# Patient Record
Sex: Female | Born: 1955 | Race: White | Hispanic: No | Marital: Single
Health system: Midwestern US, Academic
[De-identification: ages and names within clinical notes are randomized; demographics above are authoritative.]

---

## 2016-10-21 ENCOUNTER — Ambulatory Visit: Admit: 2016-10-21 | Discharge: 2016-10-21 | Payer: Commercial Managed Care - HMO

## 2016-10-21 ENCOUNTER — Encounter: Admit: 2016-10-21 | Discharge: 2016-10-21 | Payer: Private health insurance—other commercial Indemnity

## 2016-10-21 ENCOUNTER — Encounter: Admit: 2016-10-21 | Discharge: 2016-10-21 | Payer: Commercial Managed Care - HMO

## 2016-10-21 DIAGNOSIS — I10 Essential (primary) hypertension: ICD-10-CM

## 2016-10-21 DIAGNOSIS — F329 Major depressive disorder, single episode, unspecified: ICD-10-CM

## 2016-10-21 DIAGNOSIS — M549 Dorsalgia, unspecified: ICD-10-CM

## 2016-10-21 DIAGNOSIS — K219 Gastro-esophageal reflux disease without esophagitis: ICD-10-CM

## 2016-10-21 DIAGNOSIS — F419 Anxiety disorder, unspecified: ICD-10-CM

## 2016-10-21 DIAGNOSIS — Z01419 Encounter for gynecological examination (general) (routine) without abnormal findings: Principal | ICD-10-CM

## 2016-10-21 DIAGNOSIS — N189 Chronic kidney disease, unspecified: ICD-10-CM

## 2016-10-21 NOTE — Progress Notes
Date of Service: 10/21/2016    Subjective:             Sandy Henderson is a 61 y.o. female.    History of Present Illness  Sandy Henderson 61 y.o. G0P0000 who presents for WWE.  Has GERD that doesn't seem to be related to food intake. Hasn't tried anything for it.  No GYN issues.  H/O LEEP- paps nl since then. Mammo UTD.  No PMB.      Past Medical History:   Diagnosis Date   ??? Abnormal Papanicolaou smear of vagina and vaginal HPV    ??? Anxiety    ??? Back pain    ??? Chronic kidney disease    ??? Depression    ??? HTN (hypertension)        Past Surgical History:   Procedure Laterality Date   ??? PARTIAL THYROIDECTOMY  1985   ??? GASTRIC BYPASS  2001   ??? COLPOSCOPY     ??? LEEP PROCEDURE     ??? UMBILICAL HERNIA REPAIR         Obstetric History    G0   P0   T0   P0   A0   L0     SAB0   TAB0   Ectopic0   Multiple0   Live Births0        Social History   Substance Use Topics   ??? Smoking status: Never Smoker   ??? Smokeless tobacco: Never Used   ??? Alcohol use Yes      Comment: weekly       Family History   Problem Relation Age of Onset   ??? Heart Disease Mother    ??? Diabetes Mother    ??? Anemia Mother    ??? Heart Disease Father    ??? Stroke Father    ??? Cancer-Ovarian Maternal Aunt    ??? Diabetes Maternal Aunt    ??? Cancer Sister    ??? Heart Disease Maternal Grandmother    ??? Heart Disease Maternal Grandfather    ??? Heart Disease Paternal Grandfather    ??? Stroke Paternal Grandfather        Allergies:  No Known Allergies     Review of Systems   Constitutional: Negative for fatigue, fever and unexpected weight change.   HENT: Negative for voice change.    Respiratory: Negative for cough and shortness of breath.    Cardiovascular: Negative for chest pain and leg swelling.   Gastrointestinal: Positive for nausea. Negative for abdominal pain, blood in stool, constipation, diarrhea and vomiting.   Genitourinary: Negative for difficulty urinating, dyspareunia, dysuria, enuresis, frequency, genital sores, hematuria, menstrual problem, pelvic pain, urgency, vaginal bleeding, vaginal discharge and vaginal pain.   Musculoskeletal: Positive for arthralgias and back pain.   Skin: Negative for rash.   Neurological: Negative for light-headedness and headaches.   Hematological: Negative for adenopathy. Does not bruise/bleed easily.   Psychiatric/Behavioral: Negative for confusion. The patient is nervous/anxious.          Objective:         ??? acyclovir (ZOVIRAX) 400 mg tablet Take 400 mg by mouth as Needed (for outbreaks).   ??? buPROPion SR(+) (WELLBUTRIN-SR) 150 mg tablet Take 150 mg by mouth twice daily.   ??? LORazepam (ATIVAN) 1 mg tablet Take 1 mg by mouth twice daily (at 10AM and 10PM).   ??? losartan (COZAAR) 50 mg tablet Take 50 mg by mouth daily.      10/21/16 10:47 AM  BP  126/84???    Pulse  97???    Weight   117.7???kg???(259???lb???6.4???oz)???    Height  168.9???cm???(66.5)???    Other Vitals   BMI 41.24 kg/m2   BSA 2.35 m2   Menstrual status: Postmenopausal   OB/Gyn status reviewed 10/21/2016   Tobacco   Smoking Status Never Smoker   Smokeless Status Never Used   Reviewed 10/21/2016             Physical Exam    General:     NAD   HEENT:  Normocephalic   Thyroid:  normal to inspection and palpation   Lymph Nodes:  Supraclavicular, and  nodes normal.   Lungs:  Effort normal.    Heart:  Normal rate and intact distal pulses.     Abdomen:  Soft. She exhibits no distension and no mass. There is no tenderness. There is no rebound and no guarding.    Neurologic:  oriented, normal mood   Vulva:  No Lesions   GU:  normal   Vagina:  Normal mucosa, no discharge   Cervix:  no lesions   Uterus:  Normal shape, position and consistency   Left Adnexa:  No masses, nodularity, tenderness   Right Adnexa:  No masses, nodularity, tenderness   Rectovaginal:  Deferred     Breast: No masses, nipple discharge, or skin changes bilaterally         Assessment and Plan:  WWE-  Pap with HPV cotest  Mammo UTD  GI referral for GERD    Lynelle Doctor MD

## 2016-11-24 ENCOUNTER — Encounter: Admit: 2016-11-24 | Discharge: 2016-11-24 | Payer: Private Health Insurance - Indemnity

## 2016-11-24 ENCOUNTER — Ambulatory Visit: Admit: 2016-11-24 | Discharge: 2016-11-25 | Payer: Commercial Managed Care - HMO

## 2016-11-24 DIAGNOSIS — N390 Urinary tract infection, site not specified: ICD-10-CM

## 2016-11-24 DIAGNOSIS — R1031 Right lower quadrant pain: ICD-10-CM

## 2016-11-24 DIAGNOSIS — I10 Essential (primary) hypertension: ICD-10-CM

## 2016-11-24 DIAGNOSIS — R1013 Epigastric pain: ICD-10-CM

## 2016-11-24 DIAGNOSIS — F419 Anxiety disorder, unspecified: ICD-10-CM

## 2016-11-24 DIAGNOSIS — Z87442 Personal history of urinary calculi: ICD-10-CM

## 2016-11-24 DIAGNOSIS — R109 Unspecified abdominal pain: Principal | ICD-10-CM

## 2016-11-24 DIAGNOSIS — Z86010 Personal history of colonic polyps: ICD-10-CM

## 2016-11-24 DIAGNOSIS — N189 Chronic kidney disease, unspecified: ICD-10-CM

## 2016-11-24 DIAGNOSIS — M549 Dorsalgia, unspecified: ICD-10-CM

## 2016-11-24 DIAGNOSIS — F329 Major depressive disorder, single episode, unspecified: ICD-10-CM

## 2016-11-24 MED ORDER — PANTOPRAZOLE 40 MG PO TBEC
40 mg | ORAL_TABLET | Freq: Every day | ORAL | 5 refills | 90.00000 days | Status: AC
Start: 2016-11-24 — End: 2017-04-14

## 2016-11-24 MED ORDER — SODIUM CHLORIDE 0.9 % IV SOLP
INTRAVENOUS | 0 refills | Status: CN
Start: 2016-11-24 — End: ?

## 2016-11-24 MED ORDER — MOVIPREP 100-7.5-2.691 GRAM PO PWPK
0 refills | Status: AC
Start: 2016-11-24 — End: 2017-02-23

## 2016-11-24 NOTE — Progress Notes
Date of Service: 11/24/2016    Subjective:             Sandy Henderson is a 61 y.o. female.    History of Present Illness         61 yr old caucasian female with h/o gastric bypass surgery who has been complaining of epigastric burning pain for the last 4-5 months. This is intermittent and she has 1-2 episodes per week lasting from 30 to 60 minutes. Denies NSAID use. Discomfort is not related to meals or BM. She also c/o right lower quadrant pain that is constant. She denies any weight loss or other alarm symptoms.   Gallbladder is in situ.   Last colonoscopy in 2014.  She has h/o kidney stones.    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    Gastrointestinal: Positive for abdominal distention.   Genitourinary: Positive for flank pain and frequency.   Musculoskeletal: Positive for arthralgias, back pain and neck pain.   Psychiatric/Behavioral: Positive for decreased concentration and sleep disturbance. The patient is nervous/anxious.    All other systems reviewed and are negative.  Comprehensive 10 point ROS taken, and otherwise negative except as above.      Active Ambulatory Problems     Diagnosis Date Noted   ??? Anxiety 03/03/2012   ??? Shingles 03/03/2012   ??? Hypertension 03/03/2012   ??? Palpitations 03/07/2012   ??? Obesity, Class III, BMI 40-49.9 (morbid obesity) (HCC) 03/07/2012     Resolved Ambulatory Problems     Diagnosis Date Noted   ??? No Resolved Ambulatory Problems     Past Medical History:   Diagnosis Date   ??? Abnormal Papanicolaou smear of vagina and vaginal HPV    ??? Anxiety    ??? Back pain    ??? Chronic kidney disease    ??? Depression    ??? HTN (hypertension)        Social History     Social History   ??? Marital status: Single Spouse name: N/A   ??? Number of children: N/A   ??? Years of education: N/A     Occupational History   ???  [employer]     Social History Main Topics   ??? Smoking status: Never Smoker   ??? Smokeless tobacco: Never Used   ??? Alcohol use 3.0 oz/week     5 Standard drinks or equivalent per week      Comment: was a heavy drinker per patient for 5 years, but now drinks socially   ??? Drug use: Yes     Types: Marijuana      Comment: history of only, quit 1990   ??? Sexual activity: Not Currently     Partners: Male     Birth control/ protection: None     Other Topics Concern   ??? Not on file     Social History Narrative   ??? No narrative on file       Family History   Problem Relation Age of Onset   ??? Heart Disease Mother    ??? Diabetes Mother    ??? Anemia Mother    ??? Heart Disease Father    ???  Stroke Father    ??? Cancer-Ovarian Maternal Aunt    ??? Diabetes Maternal Aunt    ??? Cancer Sister    ??? Heart Disease Maternal Grandmother    ??? Heart Disease Maternal Grandfather    ??? Heart Disease Paternal Grandfather    ??? Stroke Paternal Grandfather        No Known Allergies    Patient Active Problem List    Diagnosis Date Noted   ??? Palpitations 03/07/2012   ??? Obesity, Class III, BMI 40-49.9 (morbid obesity) (HCC) 03/07/2012   ??? Anxiety 03/03/2012   ??? Shingles 03/03/2012   ??? Hypertension 03/03/2012           Objective:         ??? acyclovir (ZOVIRAX) 400 mg tablet Take 400 mg by mouth as Needed (for outbreaks).   ??? buPROPion SR(+) (WELLBUTRIN-SR) 150 mg tablet Take 150 mg by mouth twice daily.   ??? diphenhydrAMINE (BENADRYL) 25 mg capsule Take 25 mg by mouth at bedtime daily.   ??? LORazepam (ATIVAN) 1 mg tablet Take 1 mg by mouth twice daily (at 10AM and 10PM).   ??? losartan (COZAAR) 50 mg tablet Take 50 mg by mouth daily.   ??? temazepam (RESTORIL) 15 mg capsule Take 15 mg by mouth at bedtime as needed.     Vitals:    11/24/16 1329   BP: 127/56   Pulse: 90   Resp: 15   Temp: 36.9 ???C (98.5 ???F)   TempSrc: Oral   Weight: 117.9 kg (260 lb) Height: 168.9 cm (66.5)     Body mass index is 41.34 kg/m???.     Physical Exam  General appearance  alert, cooperative, no distress, appears stated age, obese   Head  Normocephalic, without obvious abnormality, atraumatic   Eyes  conjunctivae/corneas clear. EOM's intact. pupils equally round, accommodation normal.   Nose Nares normal. No drainage or sinus tenderness.   Throat Lips, mucosa, and tongue normal. Teeth and gums normal   Neck supple, symmetrical, trachea midline, and no JVD   Back   symmetric, no curvature. ROM normal. No CVA tenderness   Lungs   clear to auscultation bilaterally   Chest wall  no tenderness   Heart  regular rate and rhythm, S1, S2 normal, no murmur, click, rub or gallop   Abdomen   soft, tender in epigastrium. Midline scar of prior surgery. Bowel sounds normal. No masses,  No organomegaly   Extremities extremities normal, atraumatic, no cyanosis or edema   Pulses 2+ and symmetric   Skin Skin color, texture, turgor normal. No rashes or lesions   Neurologic Normal          Assessment and Plan:  61 yr old caucasian female with h/o gastric bypass surgery who has been complaining of epigastric burning pain for the last 4-5 months. This is intermittent and she has 1-2 episodes per week lasting from 30 to 60 minutes. Denies NSAID use. Discomfort is not related to meals or BM. She also c/o right lower quadrant pain that is constant. She denies any weight loss or other alarm symptoms.   Gallbladder is in situ.  Last colonoscopy in 2014.    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 Will start her on Pantoprazole po qD.  If no improvement then will check stool for H pylori antigen and celiac serologies.  Will also schedule for EGD/Colonoscopy.  CT scan abd for the right lower quadrant abd pain as patient  is very concerned about it.  If all tests are negative and symptoms are not improving then Nortriptyline.  Refer to Urology for Kidney stones Follow up with Volney American in clinic in 12 weeks.

## 2016-11-29 DIAGNOSIS — R1011 Right upper quadrant pain: Principal | ICD-10-CM

## 2017-01-07 ENCOUNTER — Encounter: Admit: 2017-01-07 | Discharge: 2017-01-07 | Payer: Private Health Insurance - Indemnity

## 2017-01-12 ENCOUNTER — Ambulatory Visit: Admit: 2017-01-12 | Discharge: 2017-01-12 | Payer: Commercial Managed Care - HMO

## 2017-01-12 DIAGNOSIS — R109 Unspecified abdominal pain: Principal | ICD-10-CM

## 2017-01-12 LAB — POC CREATININE, RAD: Lab: 0.7 mg/dL — ABNORMAL HIGH (ref 0.4–1.00)

## 2017-01-12 MED ORDER — SODIUM CHLORIDE 0.9 % IJ SOLN
50 mL | Freq: Once | INTRAVENOUS | 0 refills | Status: CP
Start: 2017-01-12 — End: ?
  Administered 2017-01-12: 18:00:00 50 mL via INTRAVENOUS

## 2017-01-12 MED ORDER — IOHEXOL 350 MG IODINE/ML IV SOLN
100 mL | Freq: Once | INTRAVENOUS | 0 refills | Status: CP
Start: 2017-01-12 — End: ?
  Administered 2017-01-12: 18:00:00 100 mL via INTRAVENOUS

## 2017-01-19 ENCOUNTER — Encounter: Admit: 2017-01-19 | Discharge: 2017-01-19 | Payer: Private Health Insurance - Indemnity

## 2017-01-19 DIAGNOSIS — R911 Solitary pulmonary nodule: Principal | ICD-10-CM

## 2017-01-21 ENCOUNTER — Encounter: Admit: 2017-01-21 | Discharge: 2017-01-21 | Payer: Commercial Managed Care - HMO

## 2017-01-21 ENCOUNTER — Encounter: Admit: 2017-01-21 | Discharge: 2017-01-21 | Payer: Private Health Insurance - Indemnity

## 2017-01-21 DIAGNOSIS — F419 Anxiety disorder, unspecified: ICD-10-CM

## 2017-01-21 DIAGNOSIS — M549 Dorsalgia, unspecified: ICD-10-CM

## 2017-01-21 DIAGNOSIS — N189 Chronic kidney disease, unspecified: ICD-10-CM

## 2017-01-21 DIAGNOSIS — F329 Major depressive disorder, single episode, unspecified: ICD-10-CM

## 2017-01-21 DIAGNOSIS — Z87442 Personal history of urinary calculi: ICD-10-CM

## 2017-01-21 DIAGNOSIS — I10 Essential (primary) hypertension: ICD-10-CM

## 2017-01-21 DIAGNOSIS — N3941 Urge incontinence: Secondary | ICD-10-CM

## 2017-01-21 DIAGNOSIS — N390 Urinary tract infection, site not specified: Principal | ICD-10-CM

## 2017-01-21 DIAGNOSIS — N952 Postmenopausal atrophic vaginitis: ICD-10-CM

## 2017-01-21 MED ORDER — ESTRADIOL 0.01 % (0.1 MG/GRAM) VA CREA
VAGINAL | 1 refills | 43.00000 days | Status: AC
Start: 2017-01-21 — End: 2017-02-18

## 2017-01-21 MED ORDER — OXYBUTYNIN CHLORIDE 5 MG PO TR24
5 mg | ORAL_TABLET | Freq: Every day | ORAL | 1 refills | 12.00000 days | Status: AC
Start: 2017-01-21 — End: 2017-02-23

## 2017-01-23 ENCOUNTER — Encounter: Admit: 2017-01-23 | Discharge: 2017-01-23 | Payer: Private Health Insurance - Indemnity

## 2017-01-23 LAB — CULTURE-URINE W/SENSITIVITY
Lab: >10
Lab: >10 — AB

## 2017-01-23 MED ORDER — NITROFURANTOIN MONOHYD/M-CRYST 100 MG PO CAP
100 mg | ORAL_CAPSULE | Freq: Two times a day (BID) | ORAL | 0 refills | 7.00000 days | Status: AC
Start: 2017-01-23 — End: 2017-02-23

## 2017-01-25 ENCOUNTER — Ambulatory Visit: Admit: 2017-01-25 | Discharge: 2017-01-25 | Payer: Private Health Insurance - Indemnity

## 2017-01-25 ENCOUNTER — Ambulatory Visit: Admit: 2017-01-25 | Discharge: 2017-01-25 | Payer: Private health insurance—other commercial Indemnity

## 2017-01-25 ENCOUNTER — Encounter: Admit: 2017-01-25 | Discharge: 2017-01-25 | Payer: Private health insurance—other commercial Indemnity

## 2017-01-25 ENCOUNTER — Ambulatory Visit: Admit: 2017-01-25 | Discharge: 2017-01-25 | Payer: Commercial Managed Care - HMO

## 2017-01-25 DIAGNOSIS — N189 Chronic kidney disease, unspecified: ICD-10-CM

## 2017-01-25 DIAGNOSIS — K621 Rectal polyp: ICD-10-CM

## 2017-01-25 DIAGNOSIS — D123 Benign neoplasm of transverse colon: ICD-10-CM

## 2017-01-25 DIAGNOSIS — M549 Dorsalgia, unspecified: ICD-10-CM

## 2017-01-25 DIAGNOSIS — R109 Unspecified abdominal pain: ICD-10-CM

## 2017-01-25 DIAGNOSIS — Z86010 Personal history of colonic polyps: ICD-10-CM

## 2017-01-25 DIAGNOSIS — Z9884 Bariatric surgery status: ICD-10-CM

## 2017-01-25 DIAGNOSIS — K5730 Diverticulosis of large intestine without perforation or abscess without bleeding: ICD-10-CM

## 2017-01-25 DIAGNOSIS — R1011 Right upper quadrant pain: Principal | ICD-10-CM

## 2017-01-25 DIAGNOSIS — F329 Major depressive disorder, single episode, unspecified: ICD-10-CM

## 2017-01-25 DIAGNOSIS — F419 Anxiety disorder, unspecified: ICD-10-CM

## 2017-01-25 DIAGNOSIS — I10 Essential (primary) hypertension: ICD-10-CM

## 2017-01-25 MED ORDER — PROPOFOL INJ 10 MG/ML IV VIAL
0 refills | Status: DC
Start: 2017-01-25 — End: 2017-01-25
  Administered 2017-01-25 (×2): 50 mg via INTRAVENOUS
  Administered 2017-01-25: 21:00:00 60 mg via INTRAVENOUS
  Administered 2017-01-25 (×6): 50 mg via INTRAVENOUS

## 2017-01-25 MED ORDER — LIDOCAINE (PF) 200 MG/10 ML (2 %) IJ SYRG
0 refills | Status: DC
Start: 2017-01-25 — End: 2017-01-25
  Administered 2017-01-25: 21:00:00 40 mg via INTRAVENOUS

## 2017-01-25 MED ORDER — PHENYLEPHRINE IN 0.9% NACL(PF) 1 MG/10 ML (100 MCG/ML) IV SYRG
0 refills | Status: DC
Start: 2017-01-25 — End: 2017-01-25
  Administered 2017-01-25 (×3): 100 ug via INTRAVENOUS

## 2017-01-25 MED ORDER — LACTATED RINGERS IV SOLP
0 refills | Status: DC
Start: 2017-01-25 — End: 2017-01-25
  Administered 2017-01-25: 21:00:00 via INTRAVENOUS

## 2017-01-25 MED ORDER — PROPOFOL 10 MG/ML IV EMUL 20 ML (INFUSION)(AM)(OR)
INTRAVENOUS | 0 refills | Status: DC
Start: 2017-01-25 — End: 2017-01-25
  Administered 2017-01-25: 21:00:00 100 ug/kg/min via INTRAVENOUS

## 2017-01-25 NOTE — Discharge Instructions - Supplementary Instructions
EGD/Upper EUS/ERCP/Antegrade Enteroscopy  Post Upper Endoscopy Instructions      -You may have a sore throat after the procedure for 2-3 days.  Try sucrets or lozenges to help ease the pain.  If it continues please contact us.    -If you feel feverish, have a temperature of 101 degrees or higher, persistent nausea and vomiting, abdominal pain or dark stools; please notify your nurse or GI physician.    -You may have abdominal cramping following the procedure this can be relieved by belching or passing air.    -If you have redness or swelling at the IV site, place a warm, wet washcloth over the affected areas for 15 minutes, 3-4 times a day until the redness subsides.  If symptoms continue for 2-3 days, contact your regular physician.    - If you have bleeding from your mouth, over 2 tablespoons and increasing, please notify your physician.  A small amount of bleeding is normal if a biopsy or polyps were taken.  If you are vomiting blood you need to seek immediate medical attention.    - You may resume all your routine medications, if medications need to be held your physician and/or nurse will notify you post procedure.    SPECIFIC INSTRUCTIONS  INPATIENTS:  Ask for help when you get up in your room, as you may still be drowsy from your sedation.    OUTPATIENTS:  A. Because of sedation and lack of coordination, UNTIL TOMORROW, DO NOT:  1. Operate any motorized vehicle - this includes driving.  2. Sign any legal documents or conduct important business matters.  3. Use any dangerous machinery (chain saw, lawnmower, etc.).  4. Drink any alcoholic beverages.  Should you have any questions or concerns after your procedure please call [phone] M-F 8am-5:00 pm. After 5:00 pm, holidays or weekends call [phone] and ask for the GI Doctor on call.    Colon/Lower EUS/Retrograde Enteroscopy     Post Lower Endoscopy Instructions    -If you feel feverish, have a temperature of 101 degrees or higher, persistent nausea and vomiting, abdominal pain or dark stools; please notify your nurse or GI physician.    -You may have abdominal cramping following the procedure this can be relieved by belching or passing air.    -If you have redness or swelling at the IV site, place a warm, wet washcloth over the affected areas for 15 minutes, 3-4 times a day until the redness subsides.  If symptoms continue for 2-3 days, contact your regular physician.    - If you have bleeding from your bowels over 2 tablespoons and increasing, please notify your physician.  A small amount of bleeding is normal if a biopsy or polyps were taken.    - You may resume all your routine medications, if medications need to be held your physician and/or nurse will notify you post procedure.    SPECIFIC INSTRUCTIONS  INPATIENTS:  Ask for help when you get up in your room, as you may still be drowsy from your sedation.    OUTPATIENTS:  B. Because of sedation and lack of coordination, UNTIL TOMORROW, DO NOT:  5. Operate any motorized vehicle - this includes driving.  6. Sign any legal documents or conduct important business matters.  7. Use any dangerous machinery (chain saw, lawnmower, etc.).  8. Drink any alcoholic beverages.  Should you have any questions or concerns after your procedure please call [phone] M-F 8am-5:00 pm. After 5:00 pm, holidays or weekends call  [phone] and ask for the GI Doctor on call.

## 2017-01-26 ENCOUNTER — Encounter: Admit: 2017-01-26 | Discharge: 2017-01-26 | Payer: Private health insurance—other commercial Indemnity

## 2017-01-26 ENCOUNTER — Encounter: Admit: 2017-01-26 | Discharge: 2017-01-26 | Payer: Private Health Insurance - Indemnity

## 2017-01-26 DIAGNOSIS — F419 Anxiety disorder, unspecified: ICD-10-CM

## 2017-01-26 DIAGNOSIS — F329 Major depressive disorder, single episode, unspecified: ICD-10-CM

## 2017-01-26 DIAGNOSIS — N189 Chronic kidney disease, unspecified: ICD-10-CM

## 2017-01-26 DIAGNOSIS — M549 Dorsalgia, unspecified: ICD-10-CM

## 2017-01-26 DIAGNOSIS — I10 Essential (primary) hypertension: ICD-10-CM

## 2017-01-26 DIAGNOSIS — R911 Solitary pulmonary nodule: Principal | ICD-10-CM

## 2017-01-27 ENCOUNTER — Encounter: Admit: 2017-01-27 | Discharge: 2017-01-27 | Payer: Private Health Insurance - Indemnity

## 2017-02-12 ENCOUNTER — Encounter: Admit: 2017-02-12 | Discharge: 2017-02-12 | Payer: Private Health Insurance - Indemnity

## 2017-02-15 ENCOUNTER — Encounter: Admit: 2017-02-15 | Discharge: 2017-02-15 | Payer: Private Health Insurance - Indemnity

## 2017-02-16 ENCOUNTER — Encounter: Admit: 2017-02-16 | Discharge: 2017-02-16 | Payer: Private health insurance—other commercial Indemnity

## 2017-02-16 DIAGNOSIS — N390 Urinary tract infection, site not specified: Principal | ICD-10-CM

## 2017-02-16 DIAGNOSIS — R109 Unspecified abdominal pain: Principal | ICD-10-CM

## 2017-02-18 MED ORDER — ESTRADIOL 0.01 % (0.1 MG/GRAM) VA CREA
1 g | VAGINAL | 1 refills | 43.00000 days | Status: AC
Start: 2017-02-18 — End: 2019-06-05

## 2017-02-23 ENCOUNTER — Encounter: Admit: 2017-02-23 | Discharge: 2017-02-23 | Payer: Private health insurance—other commercial Indemnity

## 2017-02-23 ENCOUNTER — Ambulatory Visit: Admit: 2017-02-23 | Discharge: 2017-02-23 | Payer: Commercial Managed Care - HMO

## 2017-02-23 DIAGNOSIS — I10 Essential (primary) hypertension: ICD-10-CM

## 2017-02-23 DIAGNOSIS — F419 Anxiety disorder, unspecified: ICD-10-CM

## 2017-02-23 DIAGNOSIS — D126 Benign neoplasm of colon, unspecified: ICD-10-CM

## 2017-02-23 DIAGNOSIS — R911 Solitary pulmonary nodule: ICD-10-CM

## 2017-02-23 DIAGNOSIS — N189 Chronic kidney disease, unspecified: ICD-10-CM

## 2017-02-23 DIAGNOSIS — M549 Dorsalgia, unspecified: ICD-10-CM

## 2017-02-23 DIAGNOSIS — F329 Major depressive disorder, single episode, unspecified: ICD-10-CM

## 2017-02-23 DIAGNOSIS — K5730 Diverticulosis of large intestine without perforation or abscess without bleeding: ICD-10-CM

## 2017-02-23 DIAGNOSIS — R1013 Epigastric pain: Secondary | ICD-10-CM

## 2017-02-25 ENCOUNTER — Encounter: Admit: 2017-02-25 | Discharge: 2017-02-25 | Payer: Private health insurance—other commercial Indemnity

## 2017-02-28 ENCOUNTER — Encounter: Admit: 2017-02-28 | Discharge: 2017-02-28 | Payer: Private health insurance—other commercial Indemnity

## 2017-02-28 MED ORDER — NITROFURANTOIN MONOHYD/M-CRYST 100 MG PO CAP
100 mg | ORAL_CAPSULE | Freq: Two times a day (BID) | ORAL | 0 refills | 7.00000 days | Status: AC
Start: 2017-02-28 — End: 2017-02-28

## 2017-02-28 MED ORDER — NITROFURANTOIN MONOHYD/M-CRYST 100 MG PO CAP
100 mg | ORAL_CAPSULE | Freq: Two times a day (BID) | ORAL | 0 refills | 7.00000 days | Status: AC
Start: 2017-02-28 — End: 2017-04-14

## 2017-03-14 ENCOUNTER — Encounter: Admit: 2017-03-14 | Discharge: 2017-03-14 | Payer: Private Health Insurance - Indemnity

## 2017-03-18 ENCOUNTER — Encounter: Admit: 2017-03-18 | Discharge: 2017-03-18 | Payer: Private Health Insurance - Indemnity

## 2017-03-18 ENCOUNTER — Encounter: Admit: 2017-03-18 | Discharge: 2017-03-18 | Payer: Commercial Managed Care - HMO

## 2017-03-18 DIAGNOSIS — M549 Dorsalgia, unspecified: ICD-10-CM

## 2017-03-18 DIAGNOSIS — F419 Anxiety disorder, unspecified: ICD-10-CM

## 2017-03-18 DIAGNOSIS — N189 Chronic kidney disease, unspecified: ICD-10-CM

## 2017-03-18 DIAGNOSIS — N390 Urinary tract infection, site not specified: Principal | ICD-10-CM

## 2017-03-18 DIAGNOSIS — F329 Major depressive disorder, single episode, unspecified: ICD-10-CM

## 2017-03-18 DIAGNOSIS — D126 Benign neoplasm of colon, unspecified: ICD-10-CM

## 2017-03-18 DIAGNOSIS — I10 Essential (primary) hypertension: ICD-10-CM

## 2017-04-07 ENCOUNTER — Encounter: Admit: 2017-04-07 | Discharge: 2017-04-07 | Payer: Private Health Insurance - Indemnity

## 2017-04-07 DIAGNOSIS — F419 Anxiety disorder, unspecified: ICD-10-CM

## 2017-04-07 DIAGNOSIS — M549 Dorsalgia, unspecified: ICD-10-CM

## 2017-04-07 DIAGNOSIS — N189 Chronic kidney disease, unspecified: ICD-10-CM

## 2017-04-07 DIAGNOSIS — I10 Essential (primary) hypertension: ICD-10-CM

## 2017-04-07 DIAGNOSIS — F329 Major depressive disorder, single episode, unspecified: ICD-10-CM

## 2017-04-07 DIAGNOSIS — D126 Benign neoplasm of colon, unspecified: ICD-10-CM

## 2017-04-14 ENCOUNTER — Encounter: Admit: 2017-04-14 | Discharge: 2017-04-14 | Payer: Private health insurance—other commercial Indemnity

## 2017-04-14 ENCOUNTER — Ambulatory Visit: Admit: 2017-04-14 | Discharge: 2017-04-14 | Payer: Commercial Managed Care - HMO

## 2017-04-14 DIAGNOSIS — F419 Anxiety disorder, unspecified: ICD-10-CM

## 2017-04-14 DIAGNOSIS — N189 Chronic kidney disease, unspecified: ICD-10-CM

## 2017-04-14 DIAGNOSIS — F329 Major depressive disorder, single episode, unspecified: ICD-10-CM

## 2017-04-14 DIAGNOSIS — M549 Dorsalgia, unspecified: ICD-10-CM

## 2017-04-14 DIAGNOSIS — R918 Other nonspecific abnormal finding of lung field: Principal | ICD-10-CM

## 2017-04-14 DIAGNOSIS — J329 Chronic sinusitis, unspecified: ICD-10-CM

## 2017-04-14 DIAGNOSIS — D126 Benign neoplasm of colon, unspecified: ICD-10-CM

## 2017-04-14 DIAGNOSIS — R911 Solitary pulmonary nodule: Principal | ICD-10-CM

## 2017-04-14 DIAGNOSIS — I10 Essential (primary) hypertension: ICD-10-CM

## 2017-04-18 ENCOUNTER — Encounter: Admit: 2017-04-18 | Discharge: 2017-04-18 | Payer: Private Health Insurance - Indemnity

## 2017-04-18 DIAGNOSIS — N390 Urinary tract infection, site not specified: Principal | ICD-10-CM

## 2017-04-22 ENCOUNTER — Encounter: Admit: 2017-04-22 | Discharge: 2017-04-22 | Payer: Private health insurance—other commercial Indemnity

## 2017-06-01 ENCOUNTER — Encounter: Admit: 2017-06-01 | Discharge: 2017-06-02

## 2017-06-03 ENCOUNTER — Ambulatory Visit: Admit: 2017-06-03 | Discharge: 2017-06-04

## 2017-06-03 ENCOUNTER — Ambulatory Visit: Admit: 2017-06-03 | Discharge: 2017-06-03 | Payer: Commercial Managed Care - HMO

## 2017-06-03 ENCOUNTER — Encounter: Admit: 2017-06-03 | Discharge: 2017-06-03 | Payer: Private Health Insurance - Indemnity

## 2017-06-03 ENCOUNTER — Encounter: Admit: 2017-06-03 | Discharge: 2017-06-03 | Payer: Private health insurance—other commercial Indemnity

## 2017-06-03 ENCOUNTER — Ambulatory Visit: Admit: 2017-06-03 | Discharge: 2017-06-03 | Payer: Private health insurance—other commercial Indemnity

## 2017-06-03 DIAGNOSIS — M549 Dorsalgia, unspecified: ICD-10-CM

## 2017-06-03 DIAGNOSIS — I208 Other forms of angina pectoris: Principal | ICD-10-CM

## 2017-06-03 DIAGNOSIS — I10 Essential (primary) hypertension: ICD-10-CM

## 2017-06-03 DIAGNOSIS — F419 Anxiety disorder, unspecified: ICD-10-CM

## 2017-06-03 DIAGNOSIS — R438 Other disturbances of smell and taste: Principal | ICD-10-CM

## 2017-06-03 DIAGNOSIS — R002 Palpitations: ICD-10-CM

## 2017-06-03 DIAGNOSIS — R432 Parageusia: ICD-10-CM

## 2017-06-03 DIAGNOSIS — N189 Chronic kidney disease, unspecified: ICD-10-CM

## 2017-06-03 DIAGNOSIS — F329 Major depressive disorder, single episode, unspecified: ICD-10-CM

## 2017-06-03 DIAGNOSIS — D126 Benign neoplasm of colon, unspecified: ICD-10-CM

## 2017-06-03 LAB — CBC AND DIFF
Lab: 0 10*3/uL (ref 0–0.20)
Lab: 4.5 M/UL (ref 4.0–5.0)
Lab: 6.5 10*3/uL (ref 4.5–11.0)

## 2017-06-03 LAB — COMPREHENSIVE METABOLIC PANEL
Lab: 0.5 mg/dL (ref 0.3–1.2)
Lab: 12 U/L (ref 7–56)
Lab: 139 MMOL/L (ref 137–147)
Lab: 19 U/L (ref 7–40)
Lab: 27 MMOL/L (ref 21–30)
Lab: 3.9 MMOL/L (ref 3.5–5.1)
Lab: 8 10*3/uL (ref 3–12)
Lab: 90 U/L (ref 25–110)
Lab: >60 mL/min (ref >60)
Lab: >60 mL/min (ref >60)

## 2017-06-03 LAB — LIPID PROFILE
Lab: 107 mg/dL (ref >40)
Lab: 126 mg/dL — ABNORMAL HIGH (ref <100)
Lab: 145 mg/dL (ref 6.0–8.0)
Lab: 162 mg/dL — ABNORMAL HIGH (ref <150)
Lab: 252 mg/dL — ABNORMAL HIGH (ref <200)
Lab: 32 mg/dL (ref 8.5–10.6)

## 2017-06-03 LAB — TSH WITH FREE T4 REFLEX: Lab: 1.7 uU/mL (ref 0.35–5.00)

## 2017-06-04 ENCOUNTER — Encounter: Admit: 2017-06-04 | Discharge: 2017-06-04 | Payer: Private Health Insurance - Indemnity

## 2017-06-08 ENCOUNTER — Encounter: Admit: 2017-06-08 | Discharge: 2017-06-08 | Payer: Private Health Insurance - Indemnity

## 2017-06-08 DIAGNOSIS — I25118 Atherosclerotic heart disease of native coronary artery with other forms of angina pectoris: Principal | ICD-10-CM

## 2017-06-08 MED ORDER — ROSUVASTATIN 10 MG PO TAB
10 mg | ORAL_TABLET | Freq: Every day | ORAL | 3 refills | 90.00000 days | Status: AC
Start: 2017-06-08 — End: 2017-11-08

## 2017-06-22 ENCOUNTER — Ambulatory Visit: Admit: 2017-06-22 | Discharge: 2017-06-23 | Payer: Commercial Managed Care - HMO

## 2017-06-22 DIAGNOSIS — R002 Palpitations: ICD-10-CM

## 2017-06-22 DIAGNOSIS — I10 Essential (primary) hypertension: ICD-10-CM

## 2017-06-22 DIAGNOSIS — I208 Other forms of angina pectoris: Principal | ICD-10-CM

## 2017-06-22 MED ORDER — REGADENOSON 0.4 MG/5 ML IV SYRG
.4 mg | Freq: Once | INTRAVENOUS | 0 refills | Status: CP
Start: 2017-06-22 — End: ?

## 2017-06-22 MED ORDER — PERFLUTREN LIPID MICROSPHERES 1.1 MG/ML IV SUSP
1-20 mL | Freq: Once | INTRAVENOUS | 0 refills | Status: CP | PRN
Start: 2017-06-22 — End: ?

## 2017-06-24 ENCOUNTER — Encounter: Admit: 2017-06-24 | Discharge: 2017-06-24 | Payer: Private Health Insurance - Indemnity

## 2017-06-24 ENCOUNTER — Encounter: Admit: 2017-06-24 | Discharge: 2017-06-24 | Payer: Private health insurance—other commercial Indemnity

## 2017-06-24 DIAGNOSIS — I10 Essential (primary) hypertension: ICD-10-CM

## 2017-06-24 DIAGNOSIS — Z01812 Encounter for preprocedural laboratory examination: ICD-10-CM

## 2017-06-24 DIAGNOSIS — I25118 Atherosclerotic heart disease of native coronary artery with other forms of angina pectoris: Principal | ICD-10-CM

## 2017-06-24 MED ORDER — ASPIRIN 325 MG PO TAB
325 mg | ORAL_TABLET | Freq: Every day | ORAL | 3 refills | 30.00000 days | Status: AC
Start: 2017-06-24 — End: 2019-08-03

## 2017-06-27 ENCOUNTER — Encounter: Admit: 2017-06-27 | Discharge: 2017-06-27 | Payer: Private Health Insurance - Indemnity

## 2017-06-27 ENCOUNTER — Encounter: Admit: 2017-06-27 | Discharge: 2017-06-27 | Payer: Private health insurance—other commercial Indemnity

## 2017-06-27 DIAGNOSIS — R9439 Abnormal result of other cardiovascular function study: Principal | ICD-10-CM

## 2017-06-27 MED ORDER — ASPIRIN 325 MG PO TAB
325 mg | Freq: Once | ORAL | 0 refills | Status: CN
Start: 2017-06-27 — End: ?

## 2017-06-30 ENCOUNTER — Encounter: Admit: 2017-06-30 | Discharge: 2017-06-30 | Payer: Private Health Insurance - Indemnity

## 2017-07-08 ENCOUNTER — Encounter: Admit: 2017-07-08 | Discharge: 2017-07-08 | Payer: Private health insurance—other commercial Indemnity

## 2017-07-12 LAB — COMPREHENSIVE METABOLIC PANEL: Lab: 138

## 2017-07-14 ENCOUNTER — Encounter: Admit: 2017-07-14 | Discharge: 2017-07-14 | Payer: Private health insurance—other commercial Indemnity

## 2017-07-15 ENCOUNTER — Encounter: Admit: 2017-07-15 | Discharge: 2017-07-15 | Payer: Private Health Insurance - Indemnity

## 2017-07-15 ENCOUNTER — Encounter: Admit: 2017-07-15 | Discharge: 2017-07-15 | Payer: Private health insurance—other commercial Indemnity

## 2017-07-15 MED ORDER — SULFAMETHOXAZOLE-TRIMETHOPRIM 800-160 MG PO TAB
1 | ORAL_TABLET | Freq: Two times a day (BID) | ORAL | 0 refills | Status: AC
Start: 2017-07-15 — End: ?

## 2017-07-19 ENCOUNTER — Encounter: Admit: 2017-07-19 | Discharge: 2017-07-19 | Payer: Private Health Insurance - Indemnity

## 2017-07-19 ENCOUNTER — Encounter: Admit: 2017-07-19 | Discharge: 2017-07-19 | Payer: Private health insurance—other commercial Indemnity

## 2017-07-19 ENCOUNTER — Encounter: Admit: 2017-07-19 | Discharge: 2017-07-20 | Payer: Commercial Managed Care - HMO

## 2017-07-19 ENCOUNTER — Ambulatory Visit: Admit: 2017-07-19 | Discharge: 2017-07-19 | Payer: Commercial Managed Care - HMO

## 2017-07-19 ENCOUNTER — Ambulatory Visit: Admit: 2017-07-19 | Discharge: 2017-07-19 | Payer: Private health insurance—other commercial Indemnity

## 2017-07-19 DIAGNOSIS — N189 Chronic kidney disease, unspecified: ICD-10-CM

## 2017-07-19 DIAGNOSIS — M549 Dorsalgia, unspecified: ICD-10-CM

## 2017-07-19 DIAGNOSIS — F329 Major depressive disorder, single episode, unspecified: ICD-10-CM

## 2017-07-19 DIAGNOSIS — D126 Benign neoplasm of colon, unspecified: ICD-10-CM

## 2017-07-19 DIAGNOSIS — I10 Essential (primary) hypertension: ICD-10-CM

## 2017-07-19 DIAGNOSIS — R9439 Abnormal result of other cardiovascular function study: Principal | ICD-10-CM

## 2017-07-19 DIAGNOSIS — F419 Anxiety disorder, unspecified: ICD-10-CM

## 2017-07-19 MED ORDER — DIPHENHYDRAMINE HCL 25 MG PO CAP
25 mg | ORAL | 0 refills | Status: DC | PRN
Start: 2017-07-19 — End: 2017-07-20

## 2017-07-19 MED ORDER — ONDANSETRON HCL (PF) 4 MG/2 ML IJ SOLN
4 mg | INTRAVENOUS | 0 refills | Status: DC | PRN
Start: 2017-07-19 — End: 2017-07-20

## 2017-07-19 MED ORDER — SODIUM CHLORIDE 0.9 % IV SOLP
250 mL | INTRAVENOUS | 0 refills | Status: CP
Start: 2017-07-19 — End: ?
  Administered 2017-07-19: 17:00:00 250 mL via INTRAVENOUS

## 2017-07-19 MED ORDER — DIPHENHYDRAMINE HCL 50 MG/ML IJ SOLN
25 mg | INTRAVENOUS | 0 refills | Status: DC | PRN
Start: 2017-07-19 — End: 2017-07-20

## 2017-07-19 MED ORDER — SODIUM CHLORIDE 0.9 % IV SOLP
1000 mL | INTRAVENOUS | 0 refills | Status: DC
Start: 2017-07-19 — End: 2017-07-20

## 2017-07-19 MED ORDER — ASPIRIN 325 MG PO TAB
325 mg | Freq: Once | ORAL | 0 refills | Status: DC
Start: 2017-07-19 — End: 2017-07-20

## 2017-07-20 ENCOUNTER — Encounter: Admit: 2017-07-20 | Discharge: 2017-07-20 | Payer: Private health insurance—other commercial Indemnity

## 2017-07-20 DIAGNOSIS — E785 Hyperlipidemia, unspecified: ICD-10-CM

## 2017-07-20 DIAGNOSIS — I10 Essential (primary) hypertension: ICD-10-CM

## 2017-07-20 DIAGNOSIS — I25118 Atherosclerotic heart disease of native coronary artery with other forms of angina pectoris: ICD-10-CM

## 2017-07-20 LAB — POC ACTIVATED CLOTTING TIME: Lab: 270 s

## 2017-08-12 ENCOUNTER — Encounter: Admit: 2017-08-12 | Discharge: 2017-08-12 | Payer: Commercial Managed Care - HMO

## 2017-08-12 DIAGNOSIS — N390 Urinary tract infection, site not specified: ICD-10-CM

## 2017-08-12 DIAGNOSIS — N200 Calculus of kidney: Principal | ICD-10-CM

## 2017-08-12 DIAGNOSIS — R8271 Bacteriuria: ICD-10-CM

## 2017-08-18 ENCOUNTER — Encounter: Admit: 2017-08-18 | Discharge: 2017-08-18 | Payer: Private health insurance—other commercial Indemnity

## 2017-10-28 ENCOUNTER — Encounter: Admit: 2017-10-28 | Discharge: 2017-10-28 | Payer: Private Health Insurance - Indemnity

## 2017-11-08 ENCOUNTER — Ambulatory Visit: Admit: 2017-11-08 | Discharge: 2017-11-09 | Payer: Commercial Managed Care - HMO

## 2017-11-08 ENCOUNTER — Encounter: Admit: 2017-11-08 | Discharge: 2017-11-08 | Payer: Private health insurance—other commercial Indemnity

## 2017-11-08 DIAGNOSIS — N189 Chronic kidney disease, unspecified: ICD-10-CM

## 2017-11-08 DIAGNOSIS — F329 Major depressive disorder, single episode, unspecified: ICD-10-CM

## 2017-11-08 DIAGNOSIS — M549 Dorsalgia, unspecified: ICD-10-CM

## 2017-11-08 DIAGNOSIS — I10 Essential (primary) hypertension: ICD-10-CM

## 2017-11-08 DIAGNOSIS — R9389 Abnormal findings on diagnostic imaging of other specified body structures: ICD-10-CM

## 2017-11-08 DIAGNOSIS — F419 Anxiety disorder, unspecified: ICD-10-CM

## 2017-11-08 DIAGNOSIS — E785 Hyperlipidemia, unspecified: ICD-10-CM

## 2017-11-08 DIAGNOSIS — D126 Benign neoplasm of colon, unspecified: ICD-10-CM

## 2017-11-08 MED ORDER — ROSUVASTATIN 10 MG PO TAB
10 mg | ORAL_TABLET | Freq: Every day | ORAL | 3 refills | 90.00000 days | Status: AC
Start: 2017-11-08 — End: 2018-03-20

## 2017-11-08 MED ORDER — LOSARTAN 50 MG PO TAB
50 mg | ORAL_TABLET | Freq: Every day | ORAL | 3 refills | 30.00000 days | Status: AC
Start: 2017-11-08 — End: ?

## 2017-11-09 DIAGNOSIS — I25118 Atherosclerotic heart disease of native coronary artery with other forms of angina pectoris: Principal | ICD-10-CM

## 2018-03-20 ENCOUNTER — Encounter: Admit: 2018-03-20 | Discharge: 2018-03-20 | Payer: Private health insurance—other commercial Indemnity

## 2018-03-20 MED ORDER — ROSUVASTATIN 10 MG PO TAB
10 mg | ORAL_TABLET | Freq: Every day | ORAL | 3 refills | 90.00000 days | Status: AC
Start: 2018-03-20 — End: ?

## 2018-06-06 ENCOUNTER — Encounter: Admit: 2018-06-06 | Discharge: 2018-06-06

## 2018-06-06 DIAGNOSIS — R911 Solitary pulmonary nodule: Secondary | ICD-10-CM

## 2018-06-15 ENCOUNTER — Encounter: Admit: 2018-06-15 | Discharge: 2018-06-15

## 2018-06-15 ENCOUNTER — Ambulatory Visit: Admit: 2018-06-15 | Discharge: 2018-06-15

## 2018-06-15 DIAGNOSIS — R911 Solitary pulmonary nodule: Principal | ICD-10-CM

## 2018-06-16 ENCOUNTER — Encounter: Admit: 2018-06-16 | Discharge: 2018-06-16

## 2018-06-16 NOTE — Telephone Encounter
Called patient and told her about stability of nodules in CT scan and no need for further f/u as per radiological report.   She will schedule f/u appointment as needed.

## 2018-06-22 ENCOUNTER — Encounter: Admit: 2018-06-22 | Discharge: 2018-06-22

## 2018-10-24 ENCOUNTER — Encounter: Admit: 2018-10-24 | Discharge: 2018-10-24 | Payer: Private Health Insurance - Indemnity

## 2018-10-27 ENCOUNTER — Encounter: Admit: 2018-10-27 | Discharge: 2018-10-27 | Payer: Private Health Insurance - Indemnity

## 2019-03-27 IMAGING — MG MAMMOGRAM 3D SCREEN, BILATERAL
11 of 16 series · 11 of 16 positions shown · non-contrast
Comparison: none

[R CC (1 of 2)]
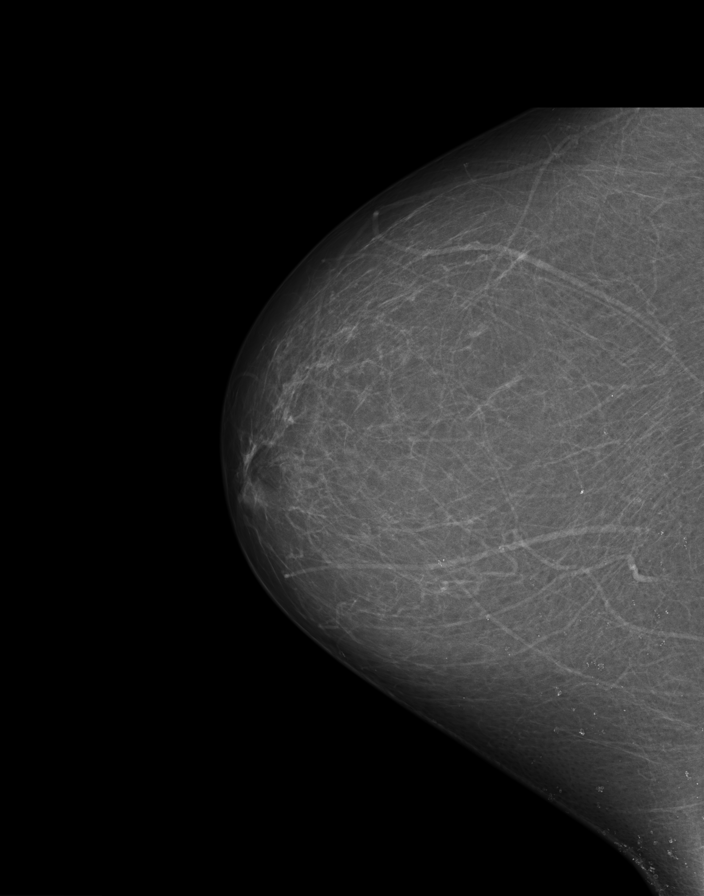

[R tomo (1 of 2)]
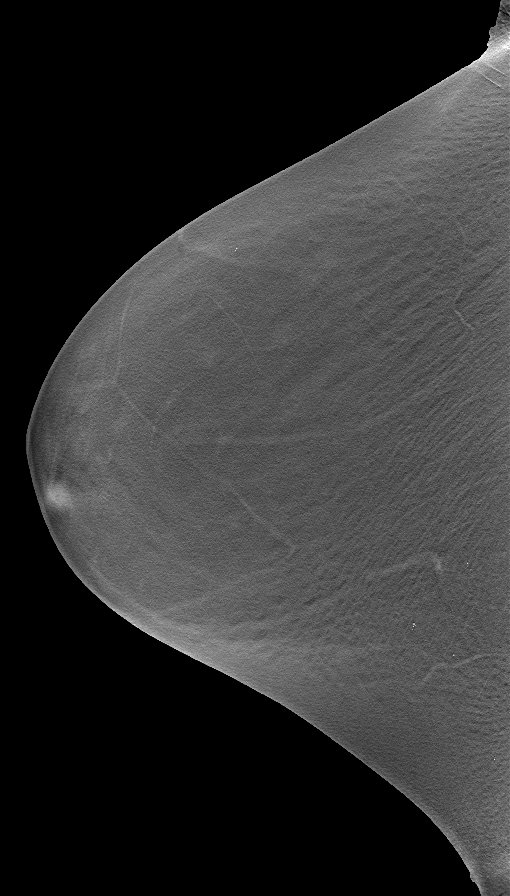

[R CC (2 of 2)]
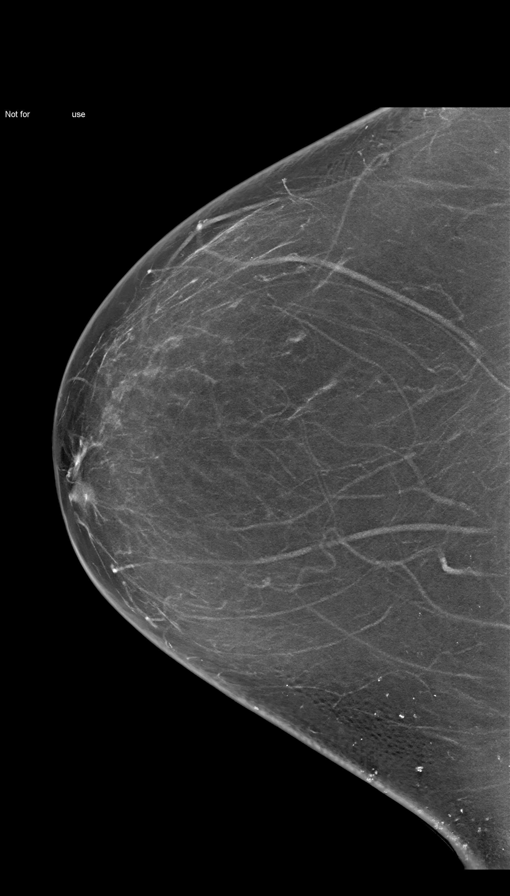

[R]
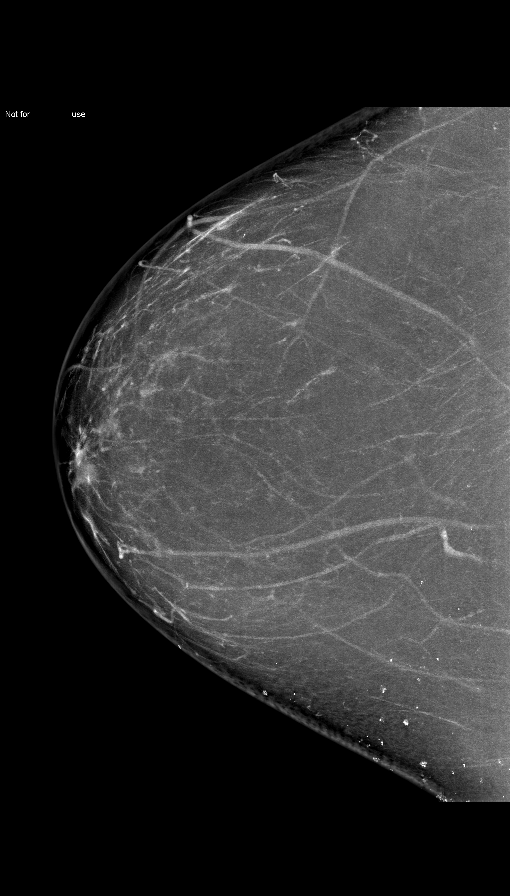

[L CC (1 of 2)]
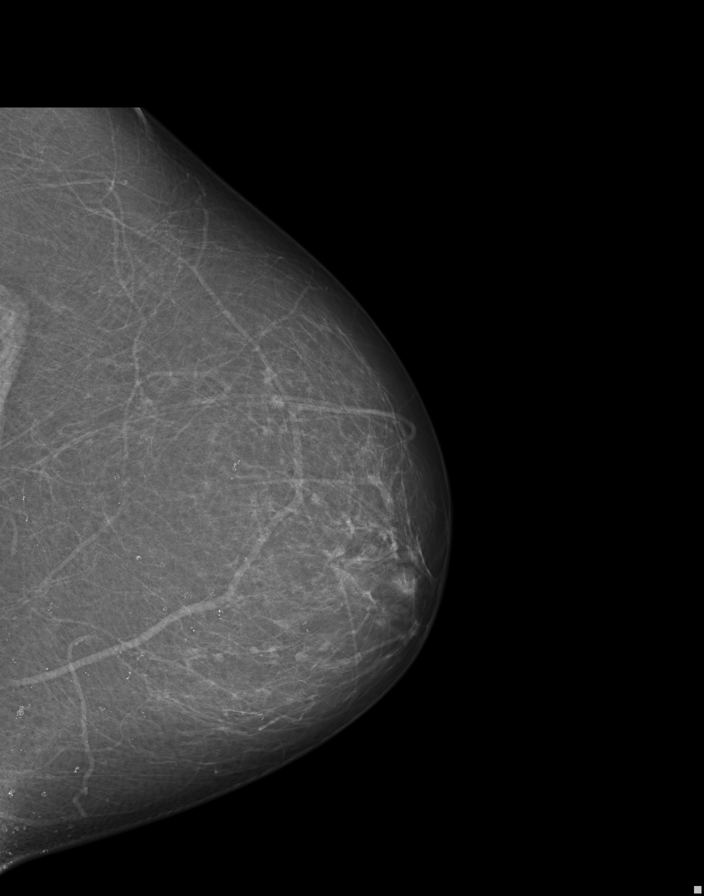

[L tomo]
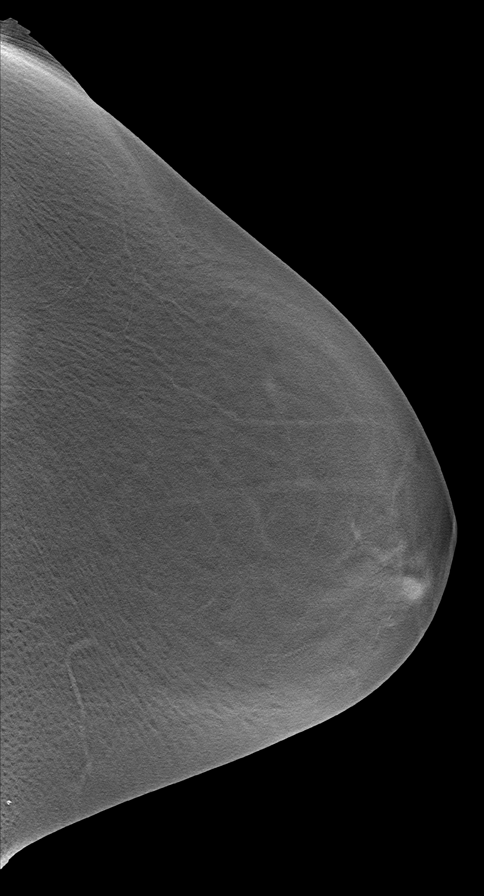

[L CC (2 of 2)]
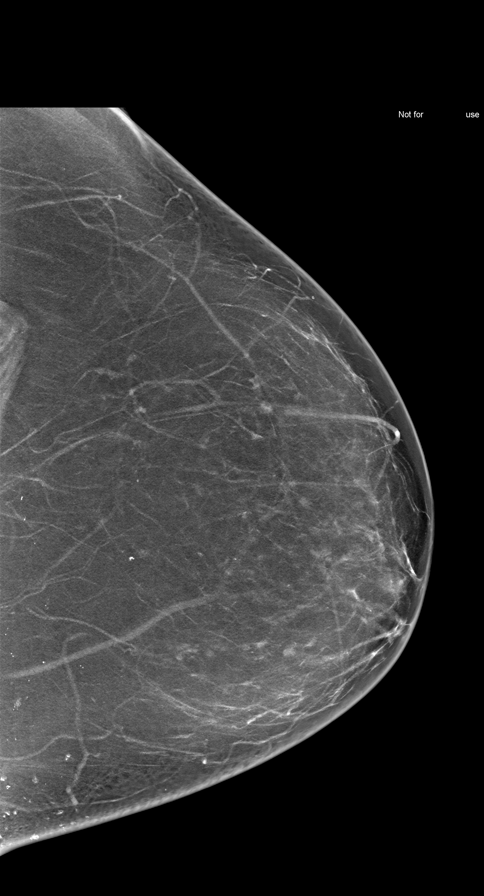

[L]
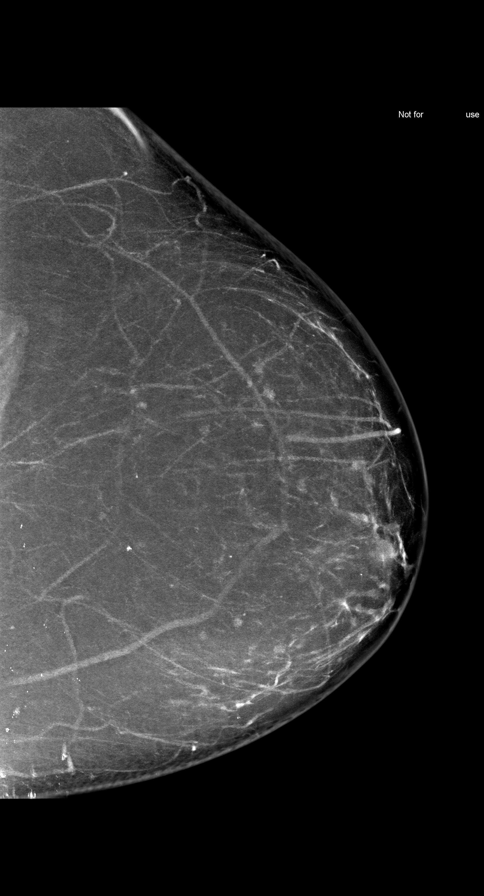

[R MLO (1 of 2)]
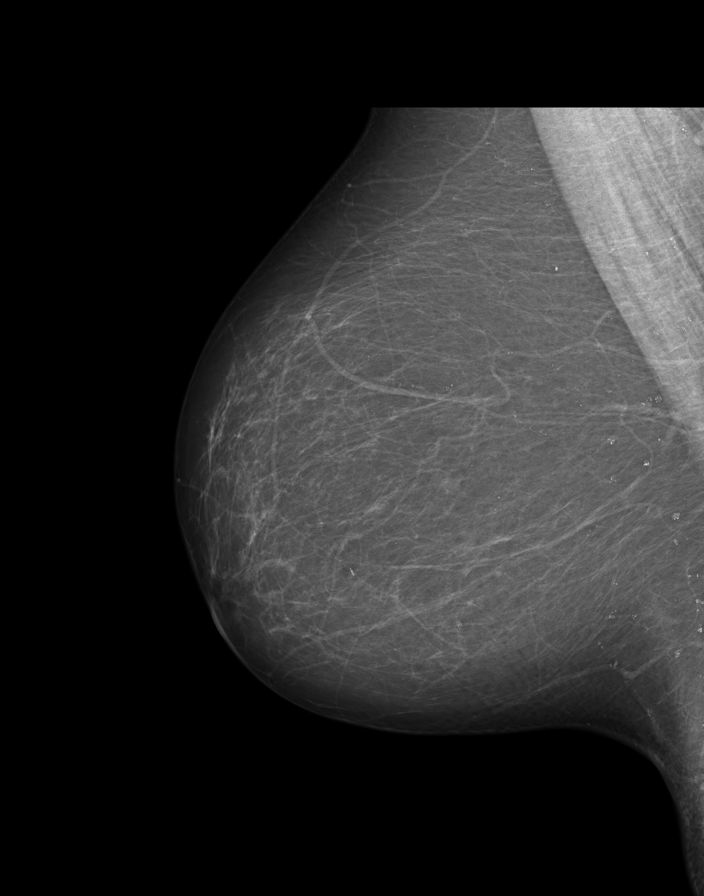

[R tomo (2 of 2)]
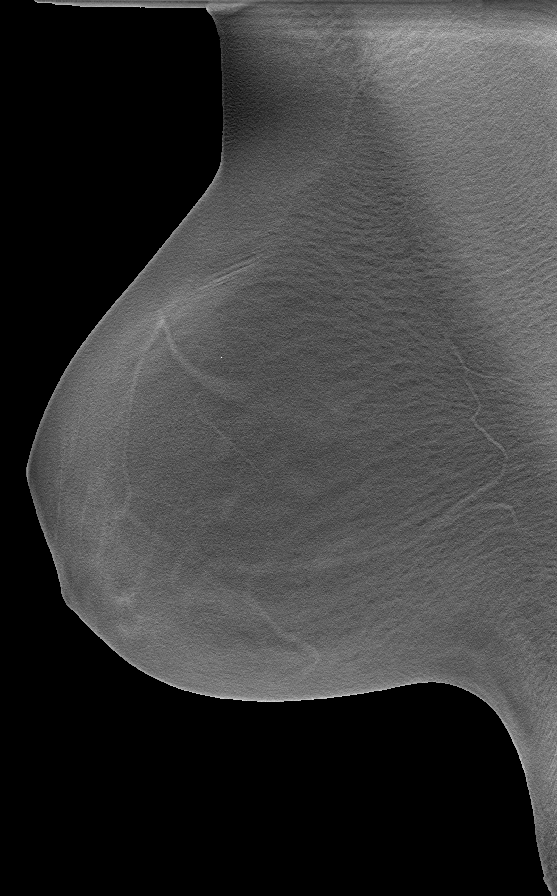

[R MLO (2 of 2)]
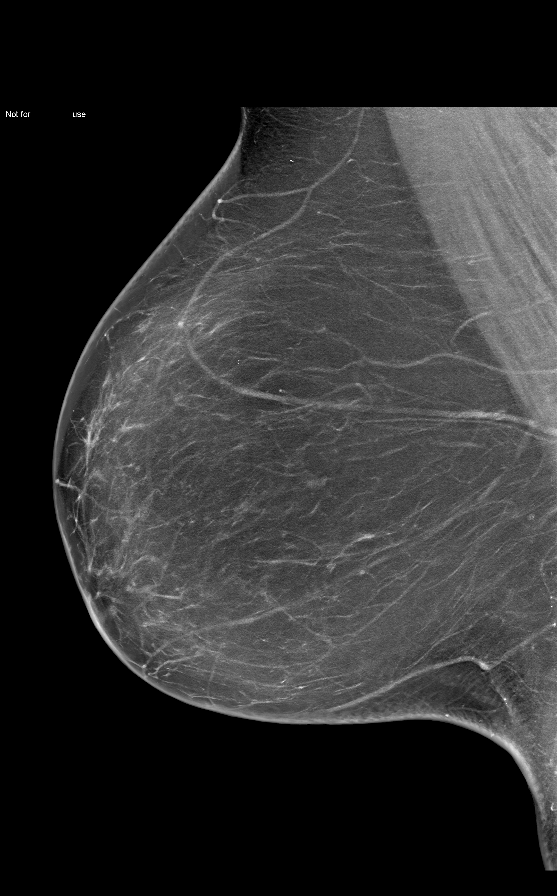

[11 of 16 positions shown; findings below may reference images not displayed]

EXAM

MAMMOGRAM

INDICATION

Screening

TECHNIQUE

2D and tomosynthesis digital craniocaudal and mediolateral oblique views were obtained of both
breasts. Computer aided detection software was utilized.

COMPARISONS

05/05/16, 06/14/14, 03/22/12

FINDINGS

The breasts are almost entirely fatty.

There is no suspicious microcalcification, architectural distortion, or spiculated mass. There are
benign bilateral dermal calcifications.

IMPRESSION

BI-RADS 1, NEGATIVE.

A reminder letter will be sent.

Tech Notes:

## 2019-06-01 ENCOUNTER — Encounter: Admit: 2019-06-01 | Discharge: 2019-06-01 | Payer: Private Health Insurance - Indemnity

## 2019-06-01 DIAGNOSIS — M25561 Pain in right knee: Secondary | ICD-10-CM

## 2019-06-05 ENCOUNTER — Encounter: Admit: 2019-06-05 | Discharge: 2019-06-05 | Payer: Private Health Insurance - Indemnity

## 2019-06-05 ENCOUNTER — Ambulatory Visit: Admit: 2019-06-05 | Discharge: 2019-06-05 | Payer: Private Health Insurance - Indemnity

## 2019-06-05 DIAGNOSIS — M1710 Unilateral primary osteoarthritis, unspecified knee: Secondary | ICD-10-CM

## 2019-06-05 DIAGNOSIS — F419 Anxiety disorder, unspecified: Secondary | ICD-10-CM

## 2019-06-05 DIAGNOSIS — M25561 Pain in right knee: Secondary | ICD-10-CM

## 2019-06-05 DIAGNOSIS — I10 Essential (primary) hypertension: Secondary | ICD-10-CM

## 2019-06-05 DIAGNOSIS — D126 Benign neoplasm of colon, unspecified: Secondary | ICD-10-CM

## 2019-06-05 DIAGNOSIS — M549 Dorsalgia, unspecified: Secondary | ICD-10-CM

## 2019-06-05 DIAGNOSIS — N189 Chronic kidney disease, unspecified: Secondary | ICD-10-CM

## 2019-06-05 DIAGNOSIS — F329 Major depressive disorder, single episode, unspecified: Secondary | ICD-10-CM

## 2019-06-05 DIAGNOSIS — R9389 Abnormal findings on diagnostic imaging of other specified body structures: Secondary | ICD-10-CM

## 2019-06-05 MED ORDER — MELOXICAM 15 MG PO TAB
15 mg | ORAL_TABLET | Freq: Every day | ORAL | 1 refills | 30.00000 days | Status: AC
Start: 2019-06-05 — End: ?

## 2019-08-03 ENCOUNTER — Ambulatory Visit: Admit: 2019-08-03 | Discharge: 2019-08-04 | Payer: Private Health Insurance - Indemnity

## 2019-08-03 ENCOUNTER — Encounter: Admit: 2019-08-03 | Discharge: 2019-08-03 | Payer: Private Health Insurance - Indemnity

## 2019-08-03 DIAGNOSIS — R9389 Abnormal findings on diagnostic imaging of other specified body structures: Secondary | ICD-10-CM

## 2019-08-03 DIAGNOSIS — M549 Dorsalgia, unspecified: Secondary | ICD-10-CM

## 2019-08-03 DIAGNOSIS — R350 Frequency of micturition: Secondary | ICD-10-CM

## 2019-08-03 DIAGNOSIS — N189 Chronic kidney disease, unspecified: Secondary | ICD-10-CM

## 2019-08-03 DIAGNOSIS — I10 Essential (primary) hypertension: Principal | ICD-10-CM

## 2019-08-03 DIAGNOSIS — F329 Major depressive disorder, single episode, unspecified: Secondary | ICD-10-CM

## 2019-08-03 DIAGNOSIS — F419 Anxiety disorder, unspecified: Secondary | ICD-10-CM

## 2019-08-03 DIAGNOSIS — D126 Benign neoplasm of colon, unspecified: Secondary | ICD-10-CM

## 2019-08-03 MED ORDER — ASPIRIN 81 MG PO TBEC
81 mg | ORAL_TABLET | Freq: Every day | ORAL | 3 refills | Status: AC
Start: 2019-08-03 — End: ?

## 2019-08-03 NOTE — Progress Notes
Date of Service: 08/03/2019    Sandy Henderson is a 64 y.o. female.       HPI     Sandy Henderson was seen in our office today for reevaluation of cardiac symptoms.  The patient is a 64 year old female followed in our office by Dr. Lazarus Gowda.  Patient has a medical history significant for hypertension, upper hyperlipidemia, nephrolithiasis, positive coronary calcium score, pulmonary nodule, aortic artery calcification on chest CT, anxiety, family history of premature CAD.    She was seen by Dr. Vivianne Spence in May, 2019 with complaints of chest pain and dyspnea on exertion. She had a stress test which was abnormal, demonstrating a small mid anterolateral, partially reversible, perfusion defect.  LVEF was 55% with abnormal thickening in the mid anterolateral and apical lateral segments. Echocardiogram was normal, other than mild concentric LVH and grade 1 diastolic dysfunction.  A coronary calcium scan showed a total calcium score of 1.6 in the RCA.  There was a 0.3 cm nodule in the right lower lobe, which was slightly less conspicuous when compared to a prior CT of the abdomen from 01/2017.  She underwent coronary angiogram on July 19, 2017 that showed normal coronary arteries.*Was started due to LDL of 126, however this was discontinued due to swelling in her feet which improved after the drug was discontinued.    Patient was last seen in our office on 11/08/2017 by Elinor Parkinson APRN.  She was asked to retrial Crestor 10 mg daily.    Today the patient presents with complaints of rapid heartbeats.  She notes that she has had very brief and rare episodes of palpitations in the past.  This past Tuesday, 7/27 she had a prolonged episode of palpitations which lasted at least 20 minutes.  This occurred while she was out working in the heat gardening.  She experienced hard and fast heartbeats during this time but did not have any associated chest pain, shortness of breath or dizziness.  She went inside and rested and symptoms resolved.  She has had no recurrence of palpitations since.  She does note that there have been a couple of times when she feels her heart beating harder but no rapid or skipped beats.  She is concerned about having atrial fibrillation as she tells me she does have a positive for atrial fibrillation in her family and her mother and cousins.  She does note some exertional dyspnea when working outside in the heat with her plants and flowers but no shortness of breath at res.  She denies having chest pain, PND, orthopnea, dizziness, lightheadedness, near syncope and syncope.  She is not reporting any TIA or CVA type symptoms.  She drinks 1 cup of coffee a day.  She does not take any decongestants but does use Zyrtec and Flonase as needed for allergies.  She does not smoke.  She drinks alcohol only socially on rare occasion.  She does note some urinary frequency.  Denies fever, chills or night sweats.  She is not exercising but does have a treadmill at home.  Today her blood pressure is 132/84, pulse 84 bpm.  Tells me she is leaving for a trip to [place] on 8/9 and will return on 08/20/2019.     Cardiovascular Studies   12 lead EKG performed in our office today reviewed by Dr. Lorelle Formosa.  EKG demonstrates sinus rhythm at 63 bpm with single VPC.  PR interval 160 ms, QRS 82 ms, QT 396 ms, QTC 406 ms.  There are no acute ST?T wave changes.    Assesssment and Plan:  1.  Palpitations. Patient has had rare episodes of palpitations in the past but this past Tuesday she had a prolonged episode as described above without recurrence.  Dehydration may have contributed.  EKG today shows a single mature ventricular contraction.  We will check an echo assess her cardiac function and structures and a Holter monitor to assess for any arrhythmias.  She was on aspirin in the past but has not been taking recently.  I am going to have her restart low-dose aspirin daily.  2. Coronary artery disease manifested by a positive coronary calcium score.  She had normal coronary arteries on cardiac cath.  3.  Hypertension. Controlled.   4.  Dyslipidemia. Treated. Patient has not had a lipid profile and some time now. Will check labs.  5.  OSA.  6.  Obesity, BMI 42.2.  7.  Pulmonary nodules. Abnormal CT chest in 05/2007, showed a 0.3 cm right lower lobe nodule, which was slightly decreased in  conspicuity since the CT abdomen of 01/12/2017, and felt to be benign.  Patient had follow-up CT chest performed on 06/15/2018.  This was followed up by Dr. Mitzie Na.  Results reviewed today indicate that pulmonary nodules are stable on CT scanning and no need for further follow-up as per radiological report.  8. Urinary frequency.  Patient reports this is been a chronic problem.    -Restart aspirin 81 mg daily.  ?Check labs today or next week including CMP, CBC, TSH, lipid panel and urinalysis.  -2D echo Doppler.  ?14-day Zio patch monitor.  This can be placed on 08/21/2019 when she returns from her trip to [place].  -Risk factors modification  -Avoid caffeine, alcohol and decongestants.  -Reommend weight reduction.  Suggest she try Weight Watchers diet plan.  -Advised to keep well-hydrated. I discussed with her the importance of maintaining good oral hydration and to drink 64 ounces of water daily.  -Advised to start a regular walking program. Encouraged to start out gradually and over the next several weeks increase her walking to 30 minutes 5 days a week.    Follow Up: Follow-up with Dr. Nickolas Madrid in our [place] office in 6 months, sooner if needed. Patient is encouraged to contact our office if she has problems prior to next visit    I have educated the patient on the plan of care.  Patient verbally expressed understanding and agreement with the plan.  Specific instructions are outlined in the after visit summary document.     Thank you for the opportunity to participate in the care of your patient.  If you have any questions or concerns please do not hesitate to contact me.     DISEASE PREVENTION:  Lipids:  Treated, on rosuvastatin 20 mg daily (restarted following last visit 11/2017)  Lipid profile 06/03/2017: Total cholesterol 252, triglycerides 162, HDL 107, LDL 126. Target LDL < 70, triglycerides < 098.      HTN:  Controlled.  Blood pressure today 132/88.     Diabetes: No.      Tobacco: Denies use.     Obesity: BMI 42.2.  A heart healthy diet and routine aerobic, exercise and weight loss are recommended     Patient education/counseling today: I reviewed records at time of patient's visit today. I reviewed medcations and instructions, medication interactions,diet/sodium restriction, exercise,reviewed labs and prior cardiac test results,  follow up plan.  DRB  Vitals:    08/03/19 0949   BP: 132/74   BP Source: Arm, Left Upper   Patient Position: Sitting   Pulse: 84   SpO2: 97%   Weight: 118.8 kg (262 lb)   Height: 1.676 m (5' 6)   PainSc: Zero     Body mass index is 42.29 kg/m?Marland Kitchen     Past Medical History  Patient Active Problem List    Diagnosis Date Noted   ? Urinary frequency 08/03/2019   ? 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 Abnormal CT of the chest 11/08/2017     - CT chest 05/2017 showed a 0.3 cm right lower lobe nodule, which was slightly decreased in   conspicuity since the CT abdomen of 01/12/2017, and felt to be begign.   - needs follow up CT May 2020      ? Dyslipidemia 07/19/2017   ? Abnormal cardiovascular stress test 07/19/2017     LHC 07/19/17 showed normal coronary anatomy.      ? Dysgeusia 06/03/2017   ? Hyposmia 06/03/2017   ? Heart palpitations 03/07/2012   ? Obesity, Class III, BMI 40-49.9 (morbid obesity) (HCC) 03/07/2012   ? Anxiety 03/03/2012   ? Shingles 03/03/2012   ? Hypertension 03/03/2012         Review of Systems   Constitution: Negative.   HENT: Negative.    Eyes: Negative.    Cardiovascular: Positive for palpitations.   Respiratory: Negative.    Endocrine: Negative.    Hematologic/Lymphatic: Negative.    Skin: Negative. Musculoskeletal: Negative.    Gastrointestinal: Negative.    Genitourinary: Negative.    Neurological: Negative.    Psychiatric/Behavioral: Negative.    Allergic/Immunologic: Negative.    All other systems reviewed and are negative.      Physical Exam  Vital signs were reviewed.   General Appearance:appears well nourished, obese, appears relaxed, in no acute distress,wearing a face mask  Skin: warm, moist, intact, no rash or lesions, no xanthomas  HEENT: unremarkable, pupils equal and round, no scleral icterus, conjunctivae and lids normal  Lips & Mouth: no pallor or cyanosis  Neck Veins:  JVP normal, neck veins are flat, neck veins are not distended, thyroidectomy scar, not enlarged, no palpable nodules  Carotid Arteries: normal carotid upstroke bilaterally, no bruits bilaterally  Chest Inspection: chest is normal in appearance  Auscultation/Percussion/Effort: lungs clear to auscultation, no rales, rhonchi, or wheezing, respirations even and unlabored, no respiratory distress  Cardiac Rhythm: regular rhythm with rare PVC and normal rate   Cardiac Auscultation: normal S1 & S2, no S3 or S4, no rub   Murmurs: no cardiac murmurs   Extremities: no lower extremity edema bilaterally, 2+ symmetric distal pulses   Abdominal Exam: soft, non-tender,non-distended, no obvious masses, bowel sounds normal, no guarding, nonpalpable abdominal aorta  Liver & Spleen: no organomegaly   Neurologic Exam: grossly intact, alert, moves all extremities equally   Orientation: oriented to time, person and place, clear historian  Gait: normal, steady, walks without assistance  Language & Memory: speech clear, patient responsive, seems to comprehend information              Problems Addressed Today  Encounter Diagnoses   Name Primary?   ? Heart palpitations Yes   ? Essential hypertension    ? Obesity, Class III, BMI 40-49.9 (morbid obesity) (HCC)    ? Anxiety    ? Urinary frequency             *  Current Medications (including today's revisions)  ? acyclovir (ZOVIRAX) 400 mg tablet Take 400 mg by mouth as Needed (for outbreaks).   ? aspirin EC 81 mg tablet Take one tablet by mouth daily. Take with food.   ? buPROPion SR(+) (WELLBUTRIN-SR) 150 mg tablet Take 150 mg by mouth twice daily.   ? busPIRone (BUSPAR) 10 mg tablet Take 10 mg by mouth twice daily. 2 Tablets BID   ? cetirizine (ZYRTEC) 10 mg tablet Take 10 mg by mouth as Needed for Allergy symptoms.   ? fluticasone propionate (FLONASE) 50 mcg/actuation nasal spray, suspension Apply 2 sprays to each nostril as directed daily. Shake bottle gently before using.   ? LORazepam (ATIVAN) 1 mg tablet Take 1 mg by mouth twice daily (at 10AM and 10PM).   ? losartan (COZAAR) 50 mg tablet Take one tablet by mouth daily.   ? meloxicam (MOBIC) 15 mg tablet Take one tablet by mouth daily.   ? multivit-mins no.63/iron/folic (M-VIT PO) Take 1 tablet by mouth daily.   ? rosuvastatin (CRESTOR) 10 mg tablet Take one tablet by mouth daily.   ? zolpidem (AMBIEN) 10 mg tablet Take 10 mg by mouth at bedtime as needed for Sleep.

## 2019-08-04 DIAGNOSIS — R002 Palpitations: Secondary | ICD-10-CM

## 2019-08-06 ENCOUNTER — Encounter: Admit: 2019-08-06 | Discharge: 2019-08-06 | Payer: Private Health Insurance - Indemnity

## 2019-08-06 DIAGNOSIS — I10 Essential (primary) hypertension: Secondary | ICD-10-CM

## 2019-08-06 DIAGNOSIS — R002 Palpitations: Secondary | ICD-10-CM

## 2019-08-06 LAB — CBC
Lab: 14
Lab: 14
Lab: 195
Lab: 30
Lab: 31
Lab: 4.6
Lab: 44
Lab: 8.5
Lab: 96

## 2019-08-08 ENCOUNTER — Encounter: Admit: 2019-08-08 | Discharge: 2019-08-08 | Payer: Private Health Insurance - Indemnity

## 2019-08-08 DIAGNOSIS — R002 Palpitations: Secondary | ICD-10-CM

## 2019-08-08 DIAGNOSIS — I10 Essential (primary) hypertension: Secondary | ICD-10-CM

## 2019-08-08 DIAGNOSIS — R350 Frequency of micturition: Secondary | ICD-10-CM

## 2019-08-08 LAB — COMPREHENSIVE METABOLIC PANEL
Lab: 0.5
Lab: 0.7
Lab: 140
Lab: 4.5
Lab: 6.7
Lab: 79
Lab: 86
Lab: 9.5
Lab: 110 — ABNORMAL HIGH (ref 98–107)
Lab: 19
Lab: 23
Lab: 3.5
Lab: 32
Lab: 42

## 2019-08-08 LAB — URINALYSIS MICROSCOPIC REFLEX TO CULTURE

## 2019-08-08 LAB — LIPID PROFILE
Lab: 156
Lab: 56

## 2019-08-08 LAB — URINALYSIS DIPSTICK REFLEX TO CULTURE

## 2019-08-08 NOTE — Telephone Encounter
08/08/2019 4:33 PM   Lab results entered and routed to Ginger McIntosh-James, APRN-NP for review.  Colvin Caroli, LPN

## 2019-08-09 ENCOUNTER — Encounter: Admit: 2019-08-09 | Discharge: 2019-08-09 | Payer: Private Health Insurance - Indemnity

## 2019-08-09 NOTE — Telephone Encounter
-----   Message from Ginger E McIntosh-James, APRN-NP sent at 08/08/2019  7:35 PM CDT -----  Please let patient know that the remainder of her labs drawn on 08/06/19 including electrolytes, glucose, renal function, liver function tests, TSH, UA and lipid panel are all in normal limits are acceptable. CBC also normal. Lipids look very good with LDL 63, triglycerides 56, HDL 82.

## 2019-08-09 NOTE — Telephone Encounter
Gave pt results per Ginger APRN.

## 2019-08-23 ENCOUNTER — Encounter: Admit: 2019-08-23 | Discharge: 2019-08-23 | Payer: Private Health Insurance - Indemnity

## 2019-08-23 ENCOUNTER — Ambulatory Visit: Admit: 2019-08-23 | Discharge: 2019-08-23 | Payer: Private Health Insurance - Indemnity

## 2019-08-23 DIAGNOSIS — R002 Palpitations: Secondary | ICD-10-CM

## 2019-08-23 NOTE — Progress Notes
Long Term Monitor Placement Record  Ordering Physician: *McIntosh-James, Ginger  Diagnosis: Assess rhythm; Palpitations  Length: 14 days  Serial Number: [id number]

## 2019-08-23 NOTE — Progress Notes
Online enrollment completed.

## 2019-09-07 ENCOUNTER — Encounter: Admit: 2019-09-07 | Discharge: 2019-09-07 | Payer: Private Health Insurance - Indemnity

## 2019-09-24 ENCOUNTER — Encounter: Admit: 2019-09-24 | Discharge: 2019-09-24 | Payer: Private Health Insurance - Indemnity

## 2019-09-25 ENCOUNTER — Encounter: Admit: 2019-09-25 | Discharge: 2019-09-25 | Payer: Private Health Insurance - Indemnity

## 2019-09-25 NOTE — Telephone Encounter
Detailed message of results and recommendations left as pt has authorized us to leave messages about treatment,  lab, procedure results, or other health information on their phone voicemail.  I asked that she call me back at [phone] should she have further questions.

## 2019-09-25 NOTE — Telephone Encounter
-----   Message from Ginger E McIntosh-James, APRN-NP sent at 09/24/2019  9:27 PM CDT -----   I have reviewed her zio patch results from monitor placed on 08/23/2019.  Study interpreted by Dr. Bernette Mayers. Please  let patient know there is predominantly normal sinus rhythm with average heart rate 78 bpm.  Rare PACs less than 1% burden and rare PVCs, less than 1% burden.  One brief run of AI VR 90 bpm lasting 6 beats.   There were 14 episodes of nonsustained SVT, longest 10 beats at 121 bpm and fastest 4 beats at 151 bpm.  Her symptoms largely correlated with occasional PACs and PVCs.No Afib/flutter.  Recs:  -As PAC and PVC burden are low and occasional  short episodes of nonsustained SVT, would not add any medications at this time unless patient is symptomatic.  -If she has recurrent palpitations, then would start low-dose beta-blocker with metoprolol XL 25 mg daily at bedtime.  Thanks. GM

## 2019-10-12 ENCOUNTER — Encounter: Admit: 2019-10-12 | Discharge: 2019-10-12 | Payer: Private Health Insurance - Indemnity

## 2019-10-12 ENCOUNTER — Ambulatory Visit: Admit: 2019-10-12 | Discharge: 2019-10-12 | Payer: Private Health Insurance - Indemnity

## 2019-10-12 DIAGNOSIS — R002 Palpitations: Secondary | ICD-10-CM

## 2019-10-12 MED ORDER — PERFLUTREN LIPID MICROSPHERES 1.1 MG/ML IV SUSP
1-20 mL | Freq: Once | INTRAVENOUS | 0 refills | Status: CP | PRN
Start: 2019-10-12 — End: ?
  Administered 2019-10-12: 20:00:00 2 mL via INTRAVENOUS

## 2019-10-12 NOTE — Progress Notes
10/12/2019 2:43 PM Procedure explained, questions answered, and agitated saline administered IV without complications as per protocol for bubble study.

## 2019-10-13 ENCOUNTER — Encounter: Admit: 2019-10-13 | Discharge: 2019-10-13 | Payer: Private Health Insurance - Indemnity

## 2019-10-15 ENCOUNTER — Encounter: Admit: 2019-10-15 | Discharge: 2019-10-15 | Payer: Private Health Insurance - Indemnity

## 2019-10-15 NOTE — Telephone Encounter
-----   Message from Ginger E McIntosh-James, APRN-NP sent at 10/13/2019  6:29 PM CDT -----  Reviewed echo result report from study completed 10/12/19, study interpreted by Dr. Arna Medici.  Echo looks good, normal LV systolic function with EF 60%, findings similar when compared to prior echo performed on 06/22/2017.  On current study there is normal diastolic function, normal RV systolic function, no significant valvular regurgitation or stenosis, no pericardial effusion, no intracardiac shunting and normal PA systolic pressure.  MyChart message sent to patient with results.    Nurses, Just an FYI that I sent result message to patient. GM

## 2019-10-15 NOTE — Telephone Encounter
Noted pt was notified by Ginger McIntosh-James, APRN-NP

## 2020-02-25 ENCOUNTER — Encounter

## 2020-02-25 DIAGNOSIS — R9439 Abnormal result of other cardiovascular function study: Secondary | ICD-10-CM

## 2020-02-25 DIAGNOSIS — R000 Tachycardia, unspecified: Secondary | ICD-10-CM

## 2020-02-25 DIAGNOSIS — R9389 Abnormal findings on diagnostic imaging of other specified body structures: Secondary | ICD-10-CM

## 2020-02-25 DIAGNOSIS — E785 Hyperlipidemia, unspecified: Secondary | ICD-10-CM

## 2020-02-25 DIAGNOSIS — F419 Anxiety disorder, unspecified: Secondary | ICD-10-CM

## 2020-02-25 DIAGNOSIS — R002 Palpitations: Secondary | ICD-10-CM

## 2020-02-25 DIAGNOSIS — I480 Paroxysmal atrial fibrillation: Secondary | ICD-10-CM

## 2020-02-25 DIAGNOSIS — F32A Depression: Secondary | ICD-10-CM

## 2020-02-25 DIAGNOSIS — N189 Chronic kidney disease, unspecified: Secondary | ICD-10-CM

## 2020-02-25 DIAGNOSIS — I10 Essential (primary) hypertension: Secondary | ICD-10-CM

## 2020-02-25 DIAGNOSIS — M549 Dorsalgia, unspecified: Secondary | ICD-10-CM

## 2020-02-25 DIAGNOSIS — D126 Benign neoplasm of colon, unspecified: Secondary | ICD-10-CM

## 2020-02-25 MED ORDER — APIXABAN 5 MG PO TAB
5 mg | ORAL_TABLET | Freq: Two times a day (BID) | ORAL | 3 refills | Status: AC
Start: 2020-02-25 — End: ?

## 2020-02-25 MED ORDER — METOPROLOL SUCCINATE 25 MG PO TB24
25 mg | ORAL_TABLET | Freq: Every day | ORAL | 3 refills | 90.00000 days | Status: AC
Start: 2020-02-25 — End: ?

## 2020-02-25 NOTE — Progress Notes
Date of Service: 02/25/2020    Sandy Henderson is a 65 y.o. female.       HPI     HPI  Ms. Danyl Witbeck was seen in our office today for reevaluation of cardiac symptoms.  The patient is a 65 year old female followed in our office by Dr. Lazarus Gowda.  Patient has a medical history significant for hypertension, upper hyperlipidemia, nephrolithiasis, positive coronary calcium score, pulmonary nodule, aortic artery calcification on chest CT, anxiety, family history of premature CAD.    She was seen by Dr. Vivianne Spence in May, 2019 with complaints of chest pain and dyspnea on exertion. She had a stress test which was abnormal, demonstrating a small mid anterolateral, partially reversible, perfusion defect.  LVEF was 55% with abnormal thickening in the mid anterolateral and apical lateral segments. Echocardiogram was normal, other than mild concentric LVH and grade 1 diastolic dysfunction.  A coronary calcium scan showed a total calcium score of 1.6 in the RCA.  There was a 0.3 cm nodule in the right lower lobe, which was slightly less conspicuous when compared to a prior CT of the abdomen from 01/2017.  She underwent coronary angiogram on July 19, 2017 that showed normal coronary arteries.  She was started on statin with an LDL of 126, however this was discontinued due to swelling in her feet which improved after the drug was discontinued.    She was last seen in our office on 08/03/2019 with complaints of palpitations.  An EKG showed sinus rhythm with a single premature ventricular contraction.  She was asked to undergo an echo as well as a Holter monitor to assess for any arrhythmias..  In addition, she was restarted on aspirin 81 mg daily, advised on risk factors modification, recommended to work on weight reduction and start a regular walking program.    An echo performed on 10/12/2019 normal left ventricular systolic function with EF 60%.  No wall motion abnormalities.  Normal diastolic function normal right ventricular contractility.  Mild aortic valve sclerosis without stenosis.  Normal PA systolic pressure.    She did not end up getting Holter monitor, which she was supposed to have done after she returned from a trip to [place].    Today she reports complaints of exertional dyspnea, palpitations and her heart beats.  Symptoms started around February 9 when she started feeling poorly.  She noticed that she was a little dizzy when she felt her heart pounding hard with associated shortness of breath.  She felt like her heart was beating erratically with fast and strong heartbeats and she could not catch her breath.  Symptoms lasted off and on for 3 days.  She made an appointment with her primary care provider, Rex Kras, PA-C but then she felt better so she canceled that visit. She had more palpitations last week and some mild exertional dyspnea , but not as severe as she had earlier in the month.  She does note that occasionally her hear rates are faster than at others.   She tells me that for the last week her pulse has been elevated on occasion,  but not on a daily basis.  She tells me that her last episode of rapid heartbeats and palpitations that she noticed with on Friday, 02/22/2020.  She feels like she does have some slight puffiness in her legs from time to time but overall not having swelling at present time.  She denies chest pain, exertional chest pain, PND, orthopnea near syncope and syncope.  Denies fever, chills and night sweats.  She has had both vaccines for COVID-19 but has not received a booster.  Of note is that she started recently on a keto diet in January 2 help with weight reduction.  She was exercising prior to that but has not been exercising much since especially over the past couple of weeks to feeling poorly.  Her blood pressure today is 120/76, pulse 100 bpm manually and 115 bpm by EKG. She does not smoke.  She drinks alcohol only socially on rare occasion.        Cardiovascular Studies  12 lead reviewed by Dr. Dickie La. The EKG demonstrates atrial fibrillation , rate 115 bpm.  QRS 70 ms, QT 320 ms, QTC 454 ms.    Assessment and Plan:  1.  Atrial fibrillation with RVR, new onset.  Patient is complaining of intermittent tachypalpitations and dyspnea on exertion.  Her EKG today confirms atrial fibrillation at 115 bpm.  She is on aspirin 81 mg daily but is not on anticoagulation.  An echo done in October 20 20 mg unremarkable with normal LV systolic function, normal diastolic function and normal RV function.  Her PA systolic pressure was normal and no significant valvular abnormalities.  Her last laboratories from August 2021 showed normal potassium, sodium and creatinine.  Normal coronary arteries, no CAD.  CHADSVASC score 2 (HTN, female gender).  I collaborated with Dr.ZSherryll Burger in the office today with recommendations outlined below.    2.  Coronary artery disease manifested by positive coronary calcium score.  She had normal coronary arteries on cardiac catheterization in July 2019.    3.  Hypertension.  Controlled on current therapy.    4.Hyperlipidemia.  Treated.  Last lipid profile from 08/06/2019 showed total cholesterol 156, triglycerides 56, HDL 42, LDL 63.  LDL at target.    5.  OSA.    6.  Elevated BMI 42.1.     7.  Pulmonary nodules. Abnormal CT chest in 05/2007, showed a 0.3 cm right lower lobe nodule, which was slightly decreased in  conspicuity since the CT abdomen of 01/12/2017, and felt to be benign.  Patient had follow-up CT chest performed on 06/15/2018.  This was followed up by Dr. Mitzie Na.    Last CT scan report indicates pulmonary nodules are stable on CT scanning and no need for further follow-up as per radiological report.    -Start apixaban 5 mg twice daily.  -Start Toprol-XL 25 mg daily.  -Stop aspirin.   ?14-day Zio patch monitor to assess A. fib burden and rate control.  -May need direct cardioversion.   ?Check CMP, BNP and magnesium this week.  ?-Recommend continue efforts at weight reduction through diet and exercise.  She is going to continue on the keto diet.  Advised her to try to lose 10 pounds over the next 2 to 3 months.  -Advised to keep well-hydrated, advised to drink at least 64 to 72 ounces of water and noncaffeinated fluids daily and to minimize caffeine to no more than 8 ounces a day.  -Advised to resume a regular walking program..     Follow Up: Follow-up with Dr. Nickolas Madrid in our [place] office in about 6 weeks to reassess symptoms and make further treatment plan in regards to her atrial fibrillation.  Patient is encouraged to contact our office if she has problems prior to next visit  ?  I have educated the patient on the plan of care.? Patient verbally expressed understanding and  agreement with the plan.? Instructions are outlined in the after visit summary document.  ?  Thank you for the opportunity to participate in the care of your patient.? If you have any questions or concerns please do not hesitate to contact me.    I spent 60 minutes on this encounter including time for chart review, assessment, formulation of treatment plan and documentation.  Total counseling time with the patient today was greater than discussing her heart disease and Afib, reviewed medications, medication interactions, diet/sodium restriction, blood pressure monitoring, blood pressure goals, weight reduction, exercise,anticoagulation, follow up plan.  We reviewed the abo patient's satisfaction. .     ?     DRB   Vitals:    02/25/20 1025   BP: 120/76   BP Source: Arm, Left Upper   Patient Position: Sitting   Pulse: 100   Resp: 18   SpO2: 97%   Weight: 120.3 kg (265 lb 3.2 oz)   Height: 168.9 cm (5' 6.5)   PainSc: Zero     Body mass index is 42.16 kg/m?Marland Kitchen     Past Medical History  Patient Active Problem List    Diagnosis Date Noted   ? Tachycardia 02/25/2020   ? Paroxysmal atrial fibrillation (HCC) 02/25/2020   ? Urinary frequency 08/03/2019   ? 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 Abnormal CT of the chest 11/08/2017     - CT chest 05/2017 showed a 0.3 cm right lower lobe nodule, which was slightly decreased in   conspicuity since the CT abdomen of 01/12/2017, and felt to be begign.   - needs follow up CT May 2020      ? Dyslipidemia 07/19/2017   ? Abnormal cardiovascular stress test 07/19/2017     LHC 07/19/17 showed normal coronary anatomy.      ? Dysgeusia 06/03/2017   ? Hyposmia 06/03/2017   ? Heart palpitations 03/07/2012   ? Obesity, Class III, BMI 40-49.9 (morbid obesity) (HCC) 03/07/2012   ? Anxiety 03/03/2012   ? Shingles 03/03/2012   ? Hypertension 03/03/2012         Review of Systems   Constitutional: Negative.   HENT: Negative.    Eyes: Negative.    Cardiovascular: Positive for dyspnea on exertion and palpitations.   Respiratory: Negative.    Endocrine: Negative.    Hematologic/Lymphatic: Negative.    Skin: Negative.    Musculoskeletal: Negative.    Gastrointestinal: Negative.    Genitourinary: Negative.    Neurological: Positive for light-headedness.   Psychiatric/Behavioral: Negative.    Allergic/Immunologic: Negative.        Physical Exam  Vital signs were reviewed.   General Appearance:appears well nourished, obese with BMI 42.1., appears relaxed, in no acute distress,wearing a face mask  Skin:?warm, moist, intact, no rash or lesions, no xanthomas  HEENT:?unremarkable, pupils equal and round, no scleral icterus, conjunctivae and lids normal  Lips & Mouth: no pallor or cyanosis  Neck Veins:? JVP normal, neck veins are flat, neck veins are not distended, thyroidectomy scar, not enlarged, no palpable nodules  Carotid Arteries:?normal carotid upstroke bilaterally, no bruits bilaterally  Chest Inspection:?chest is normal in appearance  Auscultation/Percussion/Effort:?lungs clear to auscultation, no rales, rhonchi, or wheezing, respirations even and unlabored, no respiratory distress  Cardiac Rhythm:?irregular, irregular rhythm, tachycardia with HR 115 bpm  Cardiac Auscultation:?normal S1 &?S2, no S3 or S4, no rub   Murmurs:?no cardiac murmurs   Extremities:?no lower extremity edema bilaterally, 2+ symmetric distal pulses   Abdominal Exam:?soft, non-tender,non-distended, no  obvious masses, bowel sounds normal, no guarding, nonpalpable abdominal aorta  Liver & Spleen:?no organomegaly   Neurologic Exam:?grossly intact, alert, moves all extremities equally   Orientation: oriented to time, person and place, clear historian  Gait: normal, steady, walks without assistance  Language & Memory: speech clear, patient responsive, seems to comprehend informationa          Cardiovascular Health Factors  Vitals BP Readings from Last 3 Encounters:   02/25/20 120/76   10/12/19 (!) 142/85   08/03/19 132/74     Wt Readings from Last 3 Encounters:   02/25/20 120.3 kg (265 lb 3.2 oz)   10/12/19 119.7 kg (263 lb 12.8 oz)   08/03/19 118.8 kg (262 lb)     BMI Readings from Last 3 Encounters:   02/25/20 42.16 kg/m?   10/12/19 42.58 kg/m?   08/03/19 42.29 kg/m?      Smoking Social History     Tobacco Use   Smoking Status Never Smoker   Smokeless Tobacco Never Used      Lipid Profile Cholesterol   Date Value Ref Range Status   08/06/2019 156  Final     HDL   Date Value Ref Range Status   08/06/2019 82  Final     LDL   Date Value Ref Range Status   08/06/2019 63  Final     Triglycerides   Date Value Ref Range Status   08/06/2019 56  Final      Blood Sugar No results found for: HGBA1C  Glucose   Date Value Ref Range Status   08/06/2019 86  Final   07/12/2017 95  Final   06/03/2017 98 70 - 100 MG/DL Final          Problems Addressed Today  Encounter Diagnoses   Name Primary?   ? Paroxysmal atrial fibrillation (HCC) Yes   ? Primary hypertension    ? Dyslipidemia    ? Abnormal CT of the chest    ? Abnormal cardiovascular stress test    ? Obesity, Class III, BMI 40-49.9 (morbid obesity) (HCC)    ? Heart palpitations    ? Tachycardia             *       Current Medications (including today's revisions)  ? acyclovir (ZOVIRAX) 400 mg tablet Take 400 mg by mouth as Needed (for outbreaks).   ? apixaban (ELIQUIS) 5 mg tablet Take one tablet by mouth twice daily.   ? aspirin EC 81 mg tablet Take one tablet by mouth daily. Take with food.   ? buPROPion SR(+) (WELLBUTRIN-SR) 150 mg tablet Take 150 mg by mouth twice daily.   ? busPIRone (BUSPAR) 10 mg tablet Take 10 mg by mouth twice daily. 2 Tablets BID   ? fexofenadine (ALLEGRA) 180 mg tablet Take 180 mg by mouth daily.   ? fluticasone propionate (FLONASE) 50 mcg/actuation nasal spray, suspension Apply 2 sprays to each nostril as directed daily. Shake bottle gently before using.   ? LORazepam (ATIVAN) 1 mg tablet Take 1 mg by mouth twice daily (at 10AM and 10PM).   ? losartan (COZAAR) 50 mg tablet Take one tablet by mouth daily.   ? metoprolol XL (TOPROL XL) 25 mg extended release tablet Take one tablet by mouth daily.   ? multivit-mins no.63/iron/folic (M-VIT PO) Take 1 tablet by mouth daily.   ? rosuvastatin (CRESTOR) 10 mg tablet Take one tablet by mouth daily.   ? zolpidem (AMBIEN) 10 mg tablet Take  10 mg by mouth at bedtime as needed for Sleep.

## 2020-02-25 NOTE — Progress Notes
Ambulatory (External) Cardiac Monitor Placement Record    Patient was seen today for placement of an ambulatory cardiac monitor.        [employer])  Serial Number: [id number]  Location where monitor was placed:  Clinic  Start Time and Date: 02/25/20 1135  Will Holter be returned by mail? Yes   Patient instructed to contact company phone number on the monitor box with questions regarding billing, placement, troubleshooting.     Joylene John, MA

## 2020-02-27 ENCOUNTER — Encounter: Admit: 2020-02-27 | Discharge: 2020-02-27 | Payer: Private Health Insurance - Indemnity

## 2020-02-27 DIAGNOSIS — I480 Paroxysmal atrial fibrillation: Secondary | ICD-10-CM

## 2020-02-27 DIAGNOSIS — R002 Palpitations: Secondary | ICD-10-CM

## 2020-02-27 DIAGNOSIS — R000 Tachycardia, unspecified: Secondary | ICD-10-CM

## 2020-02-27 DIAGNOSIS — I10 Essential (primary) hypertension: Secondary | ICD-10-CM

## 2020-02-27 LAB — COMPREHENSIVE METABOLIC PANEL
Lab: 0.5
Lab: 0.7
Lab: 109 — ABNORMAL HIGH (ref 98–107)
Lab: 145
Lab: 19
Lab: 19 — ABNORMAL HIGH (ref 0–14)
Lab: 21 — ABNORMAL LOW (ref 23–31)
Lab: 29
Lab: 36 — ABNORMAL HIGH (ref 5–34)
Lab: 4
Lab: 4.1
Lab: 6.3
Lab: 70
Lab: 86
Lab: 89
Lab: 9.6

## 2020-02-27 LAB — BNP (B-TYPE NATRIURETIC PEPTI): Lab: 120 — ABNORMAL HIGH (ref 0–100)

## 2020-02-27 LAB — MAGNESIUM: Lab: 2

## 2020-02-28 ENCOUNTER — Encounter: Admit: 2020-02-28 | Discharge: 2020-02-28 | Payer: Private Health Insurance - Indemnity

## 2020-03-03 ENCOUNTER — Encounter: Admit: 2020-03-03 | Discharge: 2020-03-03 | Payer: Private Health Insurance - Indemnity

## 2020-03-31 ENCOUNTER — Encounter: Admit: 2020-03-31 | Discharge: 2020-03-31 | Payer: Private Health Insurance - Indemnity

## 2020-03-31 NOTE — Telephone Encounter
-----   Message from [place], APRN-NP sent at 03/29/2020  7:46 PM CDT -----  Nurses, please let the patient know that her  Zio patch monitor result report (interpreted by Dr. Karle Starch) shows predominantly normal sinus rhythm with minimum heart rate 43, maximum heart rate 112 and average heart rate of 67 bpm.  There were very rare PVCs, less than 1% total burden.   There were very rare PACs, less than 1% total number of burden.  There were 13 episodes of nonsustained SVT--longest 8 beats at 108 bpm, fastest 5 beats at 135 bpm.  No significant pauses.  There is an episode of atrial fibrillation lasting 5 days and 4 hours with total burden 45%.  This was at the onset of the monitoring period  therefore  the true duration of the episode is uncertain.  Once patient converted to sinus rhythm she did not have recurrence of A. fib.  Ventricular rates while in atrial fibrillation were in the 80s to low 100s.  There was 1 episode of NSVT lasting 13 beats at 159 bpm.  A few of the patient's symptom events correlated with A. fib but some of the other symptoms did not and were associated with sinus rhythm.    She does have known paroxysmal atrial fibrillation.  When I saw her in the office at her last visit on 2/21 she was complaining of exertional dyspnea , palpitations and pounding heartbeats.  Her EKG at that visit demonstrated atrial fibrillation. CHADSVASC score 2 (HTN, female gender).  We started her on anticoagulation with apixaban 5 mg twice daily and Toprol-XL 25 mg daily.    As above, once she converted out of atrial fibrillation she has had no recurrence on Zio.     Let her know she needs to  continue on current dose of Toprol-XL.  She needs to continue on anticoagulation.    She has scheduled follow-up visit with Dr. Virgina Organ this wee on 04/04/20. [facility] will further discuss management and treatment options for her A. fib at that time.    Thanks GM

## 2020-03-31 NOTE — Telephone Encounter
Detailed message of results and recommendations left as pt has authorized us to leave messages about treatment,  lab, procedure results, or other health information on their phone voicemail.  I asked that she call me back at [phone] should she have further questions.

## 2020-04-04 ENCOUNTER — Ambulatory Visit: Admit: 2020-04-04 | Discharge: 2020-04-04 | Payer: MEDICARE

## 2020-04-04 ENCOUNTER — Encounter: Admit: 2020-04-04 | Discharge: 2020-04-04 | Payer: MEDICARE

## 2020-04-04 DIAGNOSIS — R9389 Abnormal findings on diagnostic imaging of other specified body structures: Secondary | ICD-10-CM

## 2020-04-04 DIAGNOSIS — R000 Tachycardia, unspecified: Secondary | ICD-10-CM

## 2020-04-04 DIAGNOSIS — F419 Anxiety disorder, unspecified: Secondary | ICD-10-CM

## 2020-04-04 DIAGNOSIS — D126 Benign neoplasm of colon, unspecified: Secondary | ICD-10-CM

## 2020-04-04 DIAGNOSIS — N189 Chronic kidney disease, unspecified: Secondary | ICD-10-CM

## 2020-04-04 DIAGNOSIS — Z9889 Other specified postprocedural states: Secondary | ICD-10-CM

## 2020-04-04 DIAGNOSIS — I480 Paroxysmal atrial fibrillation: Secondary | ICD-10-CM

## 2020-04-04 DIAGNOSIS — Z9884 Bariatric surgery status: Secondary | ICD-10-CM

## 2020-04-04 DIAGNOSIS — R002 Palpitations: Secondary | ICD-10-CM

## 2020-04-04 DIAGNOSIS — I10 Essential (primary) hypertension: Secondary | ICD-10-CM

## 2020-04-04 DIAGNOSIS — M549 Dorsalgia, unspecified: Secondary | ICD-10-CM

## 2020-04-04 DIAGNOSIS — F32A Depression: Secondary | ICD-10-CM

## 2020-04-04 DIAGNOSIS — G4733 Obstructive sleep apnea (adult) (pediatric): Secondary | ICD-10-CM

## 2020-04-04 DIAGNOSIS — R9431 Abnormal electrocardiogram [ECG] [EKG]: Secondary | ICD-10-CM

## 2020-04-04 DIAGNOSIS — R9439 Abnormal result of other cardiovascular function study: Secondary | ICD-10-CM

## 2020-04-04 DIAGNOSIS — E785 Hyperlipidemia, unspecified: Secondary | ICD-10-CM

## 2020-04-04 DIAGNOSIS — Z8249 Family history of ischemic heart disease and other diseases of the circulatory system: Secondary | ICD-10-CM

## 2020-04-04 NOTE — Progress Notes
Date of Service: 04/04/2020    Sandy Henderson is a 65 y.o. female.       HPI     Patient is a 65 year old white female that has a history of shortness of breath, and fatigue since last summer.  She also has sleep apnea on a CPAP machine (this has been recalled and she is currently not using it, also history of morbid obesity, status post weight reduction surgery with subsequent weight loss of approximately 90 pounds, hypertension (currently on a combination of beta 1 blocker and ARB), hyperlipidemia (currently on rosuvastatin), and no coronary artery disease (patient did undergo previous stress test evaluation, it was abnormal, subsequently she underwent a left heart catheterization in July 2019 and that study did not reveal any obstructive CAD).    Patient was seen by Sandy Henderson in our office on 02/25/2020.  She was diagnosed with atrial fibrillation and was initiated on apixaban.  Currently her CHA2DS2-VASc score is 2, this translates into 2.2% yearly risk of stroke.  There is indication for systemic anticoagulation.    In addition, patient was also evaluated with a 14 days Holter monitor in February 2022, the results were reviewed by me as well, it demonstrated that 45% of the time patient did have atrial fibrillation, in addition 1 episode of NSVT occurred the lasted 13 beats, overall ventricular ectopy was low representing less than 1% of the total number of beats.    Patient does report having symptoms of shortness of breath and fatigue.  She would like to find out what options are for treating atrial fibrillation.    A previous 14 days Holter monitor performed in August 2021 did not demonstrate any atrial arrhythmias.         Vitals:    04/04/20 0842   BP: 110/70   BP Source: Arm, Left Upper   Patient Position: Sitting   Pulse: 61   SpO2: 98%   Weight: 113.6 kg (250 lb 6.4 oz)   Height: 168.9 cm (5' 6.5)   PainSc: Zero     Body mass index is 39.81 kg/m?Marland Kitchen     Past Medical History  Patient Active Problem List    Diagnosis Date Noted   ? Family history of premature CAD 04/01/2020   ? Tachycardia 02/25/2020   ? Paroxysmal atrial fibrillation (HCC) 02/25/2020   ? Urinary frequency 08/03/2019   ? 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 Abnormal CT of the chest 11/08/2017     - CT chest 05/2017 showed a 0.3 cm right lower lobe nodule, which was slightly decreased in   conspicuity since the CT abdomen of 01/12/2017, and felt to be begign.   - needs follow up CT May 2020      ? Dyslipidemia 07/19/2017   ? Abnormal cardiovascular stress test 07/19/2017     LHC 07/19/17 showed normal coronary anatomy.      ? Dysgeusia 06/03/2017   ? Hyposmia 06/03/2017   ? Heart palpitations 03/07/2012   ? Obesity, Class III, BMI 40-49.9 (morbid obesity) (HCC) 03/07/2012   ? Anxiety 03/03/2012   ? Shingles 03/03/2012   ? Hypertension 03/03/2012         Review of Systems   Constitutional: Negative.   HENT: Negative.    Eyes: Negative.    Cardiovascular: Positive for palpitations (She states her pvc's are coming less frequent.).   Endocrine: Negative.    Skin: Negative.    Musculoskeletal: Negative.    Gastrointestinal: Negative.    Genitourinary:  Negative.    Neurological: Positive for light-headedness (She states it isnt frequent but does come time to time.).   Psychiatric/Behavioral: Negative.    Allergic/Immunologic: Negative.        Physical Exam  General Appearance: obese  Skin: warm, moist, no ulcers or xanthomas  Eyes: conjunctivae and lids normal, pupils are equal and round  Lips & Oral Mucosa: no pallor or cyanosis  Neck Veins: neck veins are flat, neck veins are not distended  Chest Inspection: chest is normal in appearance  Respiratory Effort: breathing comfortably, no respiratory distress  Auscultation/Percussion: lungs clear to auscultation, no rales or rhonchi, no wheezing  Cardiac Rhythm: Irregularly irregular rhythm  Cardiac Auscultation: S1, S2 normal, no rub, no gallop  Murmurs: no murmur  Carotid Arteries: normal carotid upstroke bilaterally, no bruit  Abdominal aorta: could not be examined due to obese adomen  Lower Extremity Edema: no lower extremity edema,  right lower extremity orthopedic boot  Abdominal Exam: soft, non-tender, no masses, bowel sounds normal  Liver & Spleen: no organomegaly  Language and Memory: patient responsive and seems to comprehend information  Neurologic Exam: neurological assessment grossly intact      Cardiovascular Studies      Cardiovascular Health Factors  Vitals BP Readings from Last 3 Encounters:   04/04/20 110/70   02/25/20 120/76   10/12/19 (!) 142/85     Wt Readings from Last 3 Encounters:   04/04/20 113.6 kg (250 lb 6.4 oz)   02/25/20 120.3 kg (265 lb 3.2 oz)   10/12/19 119.7 kg (263 lb 12.8 oz)     BMI Readings from Last 3 Encounters:   04/04/20 39.81 kg/m?   02/25/20 42.16 kg/m?   10/12/19 42.58 kg/m?      Smoking Social History     Tobacco Use   Smoking Status Never Smoker   Smokeless Tobacco Never Used      Lipid Profile Cholesterol   Date Value Ref Range Status   08/06/2019 156  Final     HDL   Date Value Ref Range Status   08/06/2019 82  Final     LDL   Date Value Ref Range Status   08/06/2019 63  Final     Triglycerides   Date Value Ref Range Status   08/06/2019 56  Final      Blood Sugar No results found for: HGBA1C  Glucose   Date Value Ref Range Status   02/26/2020 86  Final   08/06/2019 86  Final   07/12/2017 95  Final          Problems Addressed Today  Encounter Diagnoses   Name Primary?   ? Primary hypertension Yes   ? Paroxysmal atrial fibrillation (HCC)    ? Anxiety    ? Heart palpitations    ? Obesity, Class III, BMI 40-49.9 (morbid obesity) (HCC)    ? Dyslipidemia    ? Tachycardia    ? Family history of premature CAD    ? Abnormal cardiovascular stress test    ? H/O percutaneous left heart catheterization    ? Abnormal Holter monitor finding    ? H/O bariatric surgery    ? OSA (obstructive sleep apnea)        Assessment and Plan     In summary: This is a 65 year old white female who presents with the following cardiovascular/clinical issues:    1.  Atrial fibrillation?this is probably paroxysmal according to the most recent evaluation with a 14 days Holter monitor.  Patient is symptomatic with these events, her heart rate is under good control at 61 bpm on the office visit today.  2.  Primary hypertension?this is under reasonable control with the current combination of beta 1 blocker and ARB  3.  No evidence of coronary artery disease?patient was evaluated with an LHC in July 2019  4.  Status post weight reduction/bariatric surgery?patient states that she has lost approximately 96 pounds  5.  OSA?patient was prescribed a CPAP machine, however it has been recalled and currently patient does not use a CPAP machine  6.  Obesity?patient's BMI is 39.81 kg/m?    Plan:    1.  Continue all current medications  2.  We will set the patient up for a TEE cardioversion at [facility]  Due to the fact that she has paroxysmal atrial fibrillation, we will ask her to stop by [facility] and have a twelve-lead EKG obtained to confirm that she is in atrial fibrillation, before she gets on the road and takes a trip to [facility] to undergo this procedure  3.  Today we discussed at length about treatment options of atrial fibrillation, these include:  ? Continuing the AV node blocking agents  ? Proceeding with TEE guided cardioversion  ? Initiating a class Ic antiarrhythmic drug therapy, patient probably would be candidate for either flecainide or propafenone, by the most recent LHC performed in July 2019 she does not have any obstructive CAD and does not have any symptoms compatible with angina  ? We also discussed about class III antiarrhythmic drug therapy including dronedarone and perhaps initiation of in-hospital sotalol or dofetilide if patient fails other antiarrhythmic drug therapy  ? Pulmonary vein isolation is also an option if patient fails the medical therapy  4.  Patient will also be evaluated in the office with Dr. Bernette Mayers in the EP department to discuss in more detail about the above outlined options  5.  I also recommended to take vitamin D3, vitamin C and zinc as part of boosting of the immune system  6.  Overall risk factors modification  7.  Repeat with a sleep study and consider new settings on the CPAP machine  8.  Follow-up with me in [place] in approximately 6 months?in the meantime patient will undergo a TEE guided cardioversion and she also has been seen by Dr. Bernette Mayers.    Total Time Today was 50 minutes in the following activities: Preparing to see the patient, Obtaining and/or reviewing separately obtained history, Performing a medically appropriate examination and/or evaluation, Counseling and educating the patient/family/caregiver, Ordering medications, tests, or procedures, Referring and communication with other health care professionals (when not separately reported), Documenting clinical information in the electronic or other health record, Independently interpreting results (not separately reported) and communicating results to the patient/family/caregiver and Care coordination (not separately reported)         Current Medications (including today's revisions)  ? acyclovir (ZOVIRAX) 400 mg tablet Take 400 mg by mouth as Needed (for outbreaks).   ? apixaban (ELIQUIS) 5 mg tablet Take one tablet by mouth twice daily.   ? buPROPion SR(+) (WELLBUTRIN-SR) 150 mg tablet Take 150 mg by mouth twice daily.   ? busPIRone (BUSPAR) 10 mg tablet Take 10 mg by mouth twice daily. 2 Tablets BID   ? fexofenadine (ALLEGRA) 180 mg tablet Take 180 mg by mouth daily.   ? fluticasone propionate (FLONASE) 50 mcg/actuation nasal spray, suspension Apply 2 sprays to each nostril as directed daily. Shake bottle  gently before using.   ? LORazepam (ATIVAN) 1 mg tablet Take 1 mg by mouth twice daily (at 10AM and 10PM).   ? losartan (COZAAR) 50 mg tablet Take one tablet by mouth daily.   ? metoprolol XL (TOPROL XL) 25 mg extended release tablet Take one tablet by mouth daily.   ? multivit-mins no.63/iron/folic (M-VIT PO) Take 1 tablet by mouth daily.   ? rosuvastatin (CRESTOR) 10 mg tablet Take one tablet by mouth daily.   ? zolpidem (AMBIEN) 10 mg tablet Take 10 mg by mouth at bedtime as needed for Sleep.

## 2020-04-09 ENCOUNTER — Encounter: Admit: 2020-04-09 | Discharge: 2020-04-09 | Payer: MEDICARE

## 2020-04-09 NOTE — Progress Notes
Afib Clinic Note:    Patient approved to be seen in [facility]    Patient Name:  Sandy Henderson     MRN:  [id number]     DOB:  1955/08/09  Referring Physician:  internal, Samantha Crimes, Cardiology  Referral for Afib mgmt/intervention  Pt symptomatic with SOA and fatigue  Records in chart  Rhythm/Rate Control Meds: Metoprolol   Current Anticoagulation/Antiplatelet:  Eliquis (Apixaban)  BMI:  39.81 (may require referral to Wt. Mgmt.)  CHADS-VASC =    HTN (1) and Female (1)  STOP BANG = dx OSA with CPAP (not using currently d/t recall)  Device: No       ECHO: Location:  [place]     Date:  10/12/2019       EF: 60%      Left Atrium:  Normal Size  Monitor:  [facility]   Holter   02/25/2020       45% Afib Burden

## 2020-04-10 ENCOUNTER — Encounter: Admit: 2020-04-10 | Discharge: 2020-04-10 | Payer: MEDICARE

## 2020-04-10 NOTE — Telephone Encounter
-----   Message from Thomasene Mohair, RN sent at 04/10/2020  9:33 AM CDT -----  Regarding: FW: ANO or RAD  Can you guys please call and talk to her about seeing EP?    Thanks!  ----- Message -----  From: Ether Griffins  Sent: 04/10/2020   9:10 AM CDT  To: Thomasene Mohair, RN, Hiram Gash  Subject: RE: ANO or RAD                                   Pt declined appt, stating if it's not going to change anything and she's not having a procedure.  It's just more money for an appt.    Pt is willing to speak with a nurse, just in case shes missing something.  ----- Message -----  From: Thomasene Mohair, RN  Sent: 04/10/2020   8:52 AM CDT  To: Ether Griffins, Hiram Gash  Subject: ANO or RAD                                       Schedule with RAD or ANO as new patient in [place] or [place]

## 2020-04-10 NOTE — Telephone Encounter
Patient declines appointment in [facility]

## 2020-04-10 NOTE — Telephone Encounter
Hulan Fess, BSN     SS   04/10/20 10:02 AM  Note  Patient declines appointment in [facility]

## 2020-05-08 ENCOUNTER — Encounter: Admit: 2020-05-08 | Discharge: 2020-05-08 | Payer: MEDICARE

## 2020-05-19 ENCOUNTER — Encounter: Admit: 2020-05-19 | Discharge: 2020-05-19 | Payer: MEDICARE

## 2020-06-24 ENCOUNTER — Encounter: Admit: 2020-06-24 | Discharge: 2020-06-24 | Payer: MEDICARE

## 2020-06-25 ENCOUNTER — Encounter: Admit: 2020-06-25 | Discharge: 2020-06-25 | Payer: MEDICARE

## 2020-06-25 ENCOUNTER — Ambulatory Visit: Admit: 2020-06-25 | Discharge: 2020-06-25 | Payer: MEDICARE

## 2020-06-25 DIAGNOSIS — F32A Depression: Secondary | ICD-10-CM

## 2020-06-25 DIAGNOSIS — U071 COVID-19: Secondary | ICD-10-CM

## 2020-06-25 DIAGNOSIS — R059 Cough: Secondary | ICD-10-CM

## 2020-06-25 DIAGNOSIS — R430 Anosmia: Secondary | ICD-10-CM

## 2020-06-25 DIAGNOSIS — D126 Benign neoplasm of colon, unspecified: Secondary | ICD-10-CM

## 2020-06-25 DIAGNOSIS — R9389 Abnormal findings on diagnostic imaging of other specified body structures: Secondary | ICD-10-CM

## 2020-06-25 DIAGNOSIS — F419 Anxiety disorder, unspecified: Secondary | ICD-10-CM

## 2020-06-25 DIAGNOSIS — N189 Chronic kidney disease, unspecified: Secondary | ICD-10-CM

## 2020-06-25 DIAGNOSIS — M549 Dorsalgia, unspecified: Secondary | ICD-10-CM

## 2020-06-25 DIAGNOSIS — I10 Essential (primary) hypertension: Secondary | ICD-10-CM

## 2020-06-25 DIAGNOSIS — J324 Chronic pansinusitis: Secondary | ICD-10-CM

## 2020-06-25 NOTE — Patient Instructions
mucinex (blue box)  Plenty of fluid  Deep breathing      Once feeling better if sinus is still draining and you want to get CT just call us we will set it up

## 2020-06-25 NOTE — Progress Notes
HISTORY OF PRESENT ILLNESS:  Sandy Henderson is a 65 y.o. female who presents to clinic today as a new patient self-referred for coughing and congestion.  The patient was just diagnosed with COVID about a week ago on 6/14.  She was in [place] at a graduation when she was feeling sick and was diagnosed with COVID.  Towards the end of April or the beginning of May she started having an increase in sinus pressure and was treated with amoxicillin and it did help some but this was just before she was diagnosed with COVID.  Difficult to tell now what symptoms are COVID and what would be persistent sinusitis. She had an issue with loss of smell a few years ago and she tried smell retraining with Dr. Jake Church but unfortunately it did not help.  She did have a CT scan around that time which just showed a simple mucosal retention cyst in the maxillary sinus but otherwise an unremarkable CT.       Review of Systems   Constitutional: Negative.    HENT: Positive for congestion, postnasal drip, sinus pressure and sinus pain.    Eyes: Negative.    Respiratory: Positive for cough.    Cardiovascular: Negative.    Gastrointestinal: Negative.    Endocrine: Negative.    Genitourinary: Negative.    Musculoskeletal: Negative.    Skin: Negative.    Allergic/Immunologic: Negative.    Neurological: Negative.    Hematological: Negative.    Psychiatric/Behavioral: Negative.        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  Her  has a past surgical history that includes colposcopy; leep procedure; partial thyroidectomy (1985); gastric bypass (2001); Umbilical hernia repair; Colonoscopy (N/A, 01/25/2017); Upper gastrointestinal endoscopy (N/A, 01/25/2017); and Colonoscopy (01/25/2017). Medications/Allergies/Immunizations  Her current medication(s) include:   Current Outpatient Medications   Medication Sig Dispense Refill   ? acyclovir (ZOVIRAX) 400 mg tablet Take 400 mg by mouth as Needed (for outbreaks).     ? apixaban (ELIQUIS) 5 mg tablet Take one tablet by mouth twice daily. 180 tablet 3   ? buPROPion SR(+) (WELLBUTRIN-SR) 150 mg tablet Take 150 mg by mouth twice daily.     ? busPIRone (BUSPAR) 10 mg tablet Take 10 mg by mouth twice daily. 2 Tablets BID     ? fexofenadine (ALLEGRA) 180 mg tablet Take 180 mg by mouth daily.     ? flecainide (TAMBOCOR) 50 mg tablet Take 50 mg by mouth twice daily.     ? fluticasone propionate (FLONASE) 50 mcg/actuation nasal spray, suspension Apply 2 sprays to each nostril as directed daily. Shake bottle gently before using.     ? LORazepam (ATIVAN) 1 mg tablet Take 1 mg by mouth twice daily (at 10AM and 10PM).     ? losartan (COZAAR) 50 mg tablet Take one tablet by mouth daily. 90 tablet 3   ? metoprolol XL (TOPROL XL) 25 mg extended release tablet Take one tablet by mouth daily. (Patient taking differently: Take 50 mg by mouth daily.) 90 tablet 3   ? multivit-mins no.63/iron/folic (M-VIT PO) Take 1 tablet by mouth daily.     ? rosuvastatin (CRESTOR) 10 mg tablet Take one tablet by mouth daily. 90 tablet 3   ? zolpidem (AMBIEN) 10 mg tablet Take 10 mg  by mouth at bedtime as needed for Sleep.       No current facility-administered medications for this visit.       Allergies: Patient has no known allergies.          Vitals:    06/25/20 1603   BP: 131/74   BP Source: Arm, Right Upper   Pulse: 70   Resp: 14   SpO2: 98%   PainSc: Zero   Weight: 110.7 kg (244 lb)   Height: 168.9 cm (5' 6.5)     Body mass index is 38.79 kg/m?Marland Kitchen     Physical Exam    General: 65 y.o. female who is awake and alert and no acute distress.  SKIN:  Skin examination was unremarkable no mass or lesion appreciated no evidence of cellulitis.  No evidence of skin cancers.    EYES: Extraocular muscle mobility is intact.  No conjunctival hemorrhage.   EARS:The auricles were without deformity.  External auditory canals are clear no evidence of cerumen fungus or bacterial infection.  Tympanic membranes are without effusion or retraction.  No evidence of perforation.  No cholesteatoma.    NOSE: The nasal airway shows a straight septum without evidence of perforation or significant crusting.  There are no evidence of polypoid changes.  The inferior turbinate is not congested.  There are no active bleeding sites.    ORAL CAVITY: The oral cavity shows buccal surfaces are without evidence of lichenoid changes or mucosal disease.  No leukoplakia.  The floor of mouth is without edema.  Wharton's ducts and Stensen's ducts are patent with normal salivary flow.  The tongue is without mass or lesion. There is normal mobility and sensation.    OROPHARYNX: The oropharynx shows normal mucosa.   No significant postnasal drainage.  Uvula is without edema.  NECK: Neck was flat no adenopathy or thyromegaly.  No parotid masses or submandibular gland masses.    NEURO: Cranial nerves are intact bilaterally.  Voice quality is excellent.   PULMONARY:  No airway distress.  No stridor.  No retractions.        ASSESSMENT AND PLAN:       Areej Flook has a normal examination today except for coughing productive sputum.  She currently has COVID and the symptoms are not unexpected.  She is not in any respiratory distress.  She states she did get monoclonal antibodies.  Have recommended conservative measures and if she were to have a progression of symptoms she needs to visit with her primary care physician or an urgent care facility or emergency room.  In terms of her potential for chronic sinusitis I have recommended a CT scan a month or longer down the road once her COVID symptoms have resolved.  With her prior normal CT scan I suspect these are all going to be self-limited symptoms

## 2020-07-24 ENCOUNTER — Encounter: Admit: 2020-07-24 | Discharge: 2020-07-24 | Payer: MEDICARE

## 2021-08-12 ENCOUNTER — Encounter: Admit: 2021-08-12 | Discharge: 2021-08-12 | Payer: MEDICARE

## 2021-09-25 ENCOUNTER — Encounter: Admit: 2021-09-25 | Discharge: 2021-09-25 | Payer: MEDICARE

## 2022-06-14 ENCOUNTER — Encounter: Admit: 2022-06-14 | Discharge: 2022-06-14 | Payer: MEDICARE
